# Patient Record
Sex: Female | Born: 1950 | Race: White | Hispanic: No | Marital: Married | Smoking: Never smoker
Health system: Southern US, Community
[De-identification: ages and names within clinical notes are randomized; demographics above are authoritative.]

## PROBLEM LIST (undated history)

## (undated) DIAGNOSIS — K2270 Barrett's esophagus without dysplasia: Secondary | ICD-10-CM

## (undated) DIAGNOSIS — M8580 Other specified disorders of bone density and structure, unspecified site: Secondary | ICD-10-CM

## (undated) DIAGNOSIS — J45909 Unspecified asthma, uncomplicated: Secondary | ICD-10-CM

## (undated) DIAGNOSIS — I456 Pre-excitation syndrome: Secondary | ICD-10-CM

## (undated) DIAGNOSIS — R011 Cardiac murmur, unspecified: Secondary | ICD-10-CM

## (undated) DIAGNOSIS — G4730 Sleep apnea, unspecified: Secondary | ICD-10-CM

## (undated) HISTORY — PX: OOPHORECTOMY: SHX86

## (undated) HISTORY — PX: FRACTURE SURGERY: SHX138

## (undated) HISTORY — PX: CHOLECYSTECTOMY: SHX55

## (undated) HISTORY — PX: APPENDECTOMY: SHX54

## (undated) HISTORY — PX: OTHER SURGICAL HISTORY: SHX169

## (undated) HISTORY — PX: TUBAL LIGATION: SHX77

## (undated) HISTORY — PX: HERNIA REPAIR: SHX51

## (undated) HISTORY — PX: ROUX-EN-Y PROCEDURE: SUR1287

## (undated) HISTORY — PX: TONSILLECTOMY: SUR1361

---

## 2004-08-15 ENCOUNTER — Emergency Department: Payer: Self-pay | Admitting: Internal Medicine

## 2004-12-23 ENCOUNTER — Ambulatory Visit: Payer: Self-pay | Admitting: Gastroenterology

## 2008-09-15 ENCOUNTER — Ambulatory Visit: Payer: Self-pay | Admitting: Internal Medicine

## 2010-11-04 ENCOUNTER — Ambulatory Visit: Payer: Self-pay

## 2010-12-05 ENCOUNTER — Ambulatory Visit: Payer: Self-pay | Admitting: Family Medicine

## 2011-08-10 ENCOUNTER — Ambulatory Visit: Payer: Self-pay | Admitting: Obstetrics and Gynecology

## 2011-08-10 LAB — HEMOGLOBIN: HGB: 12.3 g/dL (ref 12.0–16.0)

## 2011-08-13 ENCOUNTER — Ambulatory Visit: Payer: Self-pay | Admitting: Obstetrics and Gynecology

## 2013-05-07 ENCOUNTER — Inpatient Hospital Stay: Payer: Self-pay | Admitting: Student

## 2013-05-07 LAB — CBC WITH DIFFERENTIAL/PLATELET
BASOS PCT: 0.9 %
Basophil #: 0 10*3/uL (ref 0.0–0.1)
EOS PCT: 1.6 %
Eosinophil #: 0.1 10*3/uL (ref 0.0–0.7)
HCT: 37.7 % (ref 35.0–47.0)
HGB: 12.3 g/dL (ref 12.0–16.0)
LYMPHS PCT: 37.7 %
Lymphocyte #: 2.1 10*3/uL (ref 1.0–3.6)
MCH: 29.3 pg (ref 26.0–34.0)
MCHC: 32.6 g/dL (ref 32.0–36.0)
MCV: 90 fL (ref 80–100)
MONO ABS: 0.6 x10 3/mm (ref 0.2–0.9)
Monocyte %: 11 %
NEUTROS ABS: 2.7 10*3/uL (ref 1.4–6.5)
Neutrophil %: 48.8 %
Platelet: 186 10*3/uL (ref 150–440)
RBC: 4.19 10*6/uL (ref 3.80–5.20)
RDW: 14.1 % (ref 11.5–14.5)
WBC: 5.5 10*3/uL (ref 3.6–11.0)

## 2013-05-07 LAB — BASIC METABOLIC PANEL
ANION GAP: 8 (ref 7–16)
BUN: 17 mg/dL (ref 7–18)
CHLORIDE: 107 mmol/L (ref 98–107)
Calcium, Total: 9 mg/dL (ref 8.5–10.1)
Co2: 25 mmol/L (ref 21–32)
Creatinine: 0.81 mg/dL (ref 0.60–1.30)
EGFR (African American): >60
EGFR (Non-African Amer.): >60
GLUCOSE: 97 mg/dL (ref 65–99)
Osmolality: 281 (ref 275–301)
Potassium: 3.5 mmol/L (ref 3.5–5.1)
SODIUM: 140 mmol/L (ref 136–145)

## 2013-05-08 LAB — BASIC METABOLIC PANEL
ANION GAP: 4 — AB (ref 7–16)
BUN: 22 mg/dL — ABNORMAL HIGH (ref 7–18)
CALCIUM: 8.5 mg/dL (ref 8.5–10.1)
CHLORIDE: 110 mmol/L — AB (ref 98–107)
CO2: 28 mmol/L (ref 21–32)
Creatinine: 1.04 mg/dL (ref 0.60–1.30)
EGFR (African American): >60
EGFR (Non-African Amer.): 58 — ABNORMAL LOW
Glucose: 71 mg/dL (ref 65–99)
OSMOLALITY: 285 (ref 275–301)
POTASSIUM: 3.9 mmol/L (ref 3.5–5.1)
SODIUM: 142 mmol/L (ref 136–145)

## 2013-05-08 LAB — CBC WITH DIFFERENTIAL/PLATELET
BASOS ABS: 0 10*3/uL (ref 0.0–0.1)
Basophil %: 1 %
EOS ABS: 0.2 10*3/uL (ref 0.0–0.7)
EOS PCT: 3.5 %
HCT: 34.4 % — ABNORMAL LOW (ref 35.0–47.0)
HGB: 11.6 g/dL — ABNORMAL LOW (ref 12.0–16.0)
LYMPHS PCT: 38.3 %
Lymphocyte #: 1.7 10*3/uL (ref 1.0–3.6)
MCH: 30.4 pg (ref 26.0–34.0)
MCHC: 33.6 g/dL (ref 32.0–36.0)
MCV: 90 fL (ref 80–100)
MONO ABS: 0.9 x10 3/mm (ref 0.2–0.9)
Monocyte %: 20.8 %
NEUTROS ABS: 1.6 10*3/uL (ref 1.4–6.5)
NEUTROS PCT: 36.4 %
PLATELETS: 162 10*3/uL (ref 150–440)
RBC: 3.81 10*6/uL (ref 3.80–5.20)
RDW: 13.9 % (ref 11.5–14.5)
WBC: 4.3 10*3/uL (ref 3.6–11.0)

## 2014-06-30 NOTE — Discharge Summary (Signed)
PATIENT NAME:  Kirsten Scott, Kirsten Scott MR#:  [id number] DATE OF BIRTH:  [birth year]  DATE OF ADMISSION:  05/07/2013 DATE OF DISCHARGE:  05/09/2013  PRIMARY CARE PHYSICIAN:  At [facility].   CHIEF COMPLAINT:  Shortness of breath, cough.   DISCHARGE DIAGNOSES: 1.  Acute respiratory failure, resolved.  2.  Pneumonia.  3.  Asthma.  4.  History of Wolff-Parkinson-White syndrome with supraventricular tachycardia.  5.  History of heart murmur.   DISCHARGE MEDICATIONS:  Probiotic one cap once a day, Multi Day plus mineral oral tablet 1 tab once a day, levofloxacin 500 mg 1 tab every 24 hours for eight days, albuterol 2 puffs 4 times a day as needed for wheezing or shortness of breath, codeine/guaifenesin 10/100 mg per 5 mL please take 10 mL every six hours as needed for cough for a total of five days.   DIET:  Low sodium.   ACTIVITY:  As tolerated.   FOLLOWUP:  Please follow with PCP within 1 to 2 weeks.  Check an x-ray of the chest in 4 to 6 weeks to evaluate for complete resolution of the pneumonia.    SIGNIFICANT LABS AND IMAGING STUDIES:  Initial white count of 5.5, hemoglobin 12.3, platelets 186.  X-ray chest, PA and lateral, showing streaky infiltrate/pneumonia in the right lower lobe.   HISTORY OF PRESENT ILLNESS AND HOSPITAL COURSE:  For full details of H and P, please see the dictation on March 1 by Dr. Posey Pronto, but briefly this is a 64 year old female with history of asthma, which is at baseline pretty well controlled, history of WPW with SVT in the past who comes in for symptoms of body aches starting on Wednesday, some neck pain and cough, which is mostly dry and also shortness of breath.  She was noted to have O2 saturation of about 85% with ambulation in the ER, was also noted to have a pneumonia and therefore admitted to the hospitalist service.  She was started on Levaquin, cough suppressants and oxygen.  She did well and was also started on nebulizers.  Soon she was taken off of the oxygen.   Her cough is better with the help of Robitussin AC.  Her acute respiratory failure has resolved.  I suspect the pneumonia is likely bacterial and she will be discharged on another eight days of Levaquin.  Her wheezing and respiratory symptoms are better.  She still has some residual shoulder pain, which I suspect is a referred pain, but at this point, she has been ambulating in the hallways without significant hypoxemia.   PHYSICAL EXAMINATION: VITAL SIGNS:  On the day of discharge, temperature is 98, pulse rate 77, respiratory rate is 18, blood pressure 111/68, O2 sat 93% on room air and it was 95% with exertion.  GENERAL:  The patient is a developed female lying in bed, no obvious distress.  HEENT:  Normocephalic, atraumatic.  LUNGS:  The patient does have some rales and rhonchi on the right base without significant wheezing or crackles otherwise.  The patient does have a heart murmur. ABDOMEN:  Soft, nontender, nondistended.  EXTREMITIES:  Do not exhibit any edema.  The patient has no obvious rashes or lesions.   At this point, she will be discharged with outpatient follow-up.   Total time spent is 38 minutes.   CODE STATUS:  THE PATIENT IS A FULL CODE.   ____________________________ Vivien Presto, MD sa:ea D: 05/09/2013 16:14:46 ET T: 05/10/2013 03:11:30 ET JOB#: [id number]  cc: Vivien Presto, MD, <Dictator>  Vivien Presto MD ELECTRONICALLY SIGNED 05/12/2013 13:05

## 2014-06-30 NOTE — H&P (Signed)
PATIENT NAME:  Kirsten Scott, Kirsten Scott MR#:  [id number] DATE OF BIRTH:  07/16/1950  DATE OF ADMISSION:  05/07/2013  PRIMARY CARE PHYSICIAN: [facility]  REFERRING PHYSICIAN: Francene Castle, MD  CHIEF COMPLAINT: Shortness of breath, cough.   HISTORY OF PRESENT ILLNESS: The patient is a 64 year old white female with history of asthma, history of Wolff-Parkinson-White syndrome with history of SVT in the past who states that she started having body aches last Wednesday with pain in her shoulder and neck and then Thursday she started having a cough, mostly dry, and then started also becoming short of breath with activity. She went to the urgent care and had a flu test that was negative. She was sent to the ED because of complaint of pain with Wolff-Parkinson-White syndrome. The patient was evaluated in the ED, had a chest x-ray that showed streaky infiltrate pneumonia of the right lower lobe. The patient also was ambulated and was noted to have oxygen saturations in the 80s with activity. She does not report any chest pain. She did have upper respiratory wheezing, but that improved with nebulizer treatment. She did complain of being feverish and having chills, but her temperature here was normal. She denied any abdominal pain, nausea, vomiting or diarrhea. Denies any urinary symptoms.   PAST MEDICAL HISTORY:  1.  Asthma.  2.  WPW syndrome with SVT.  3.  Heart murmur.   PAST SURGICAL HISTORY:  1.  Status post right hip surgery. 2.  Status post gastric bypass. 3.  Status post cholecystectomy. 4.  Status post exploratory lap. 5.  Ovarian resection for a mass.  6.  Abdominal hernia surgery. 7.  Status post sinus surgery.   SOCIAL HISTORY: Does not smoke. Does not drink. No drugs. She used to be a respiratory therapist.   CURRENT MEDICATIONS: As outpatient she is on a Ventolin inhaler q. 6 p.r.n., Tylenol 650 q. 6 p.r.n.   FAMILY HISTORY: Mother with murmur, required a pacemaker for bradycardia.    REVIEW OF SYSTEMS: CONSTITUTIONAL: Complains of fever, fatigue, and weakness. No pain. No weight loss. No weight gain.  EYES: No blurred or double vision. No pain. No redness or inflammation. No glaucoma.  ENT: No tinnitus. No ear pain. No hearing loss. No seasonal or year-round allergies. No epistaxis. No nasal discharge. No difficulty swallowing.  RESPIRATORY: Complains of cough, upper respiratory wheezing. No hemoptysis. Complains of dyspnea. Has a history of asthma. No painful respirations. No syncope.  GASTROINTESTINAL: No nausea, vomiting, diarrhea. No abdominal pain. No hematemesis. No melena. No ulcer. No guarding. No IBS. No jaundice.  GENITOURINARY: Denies any dysuria, hematuria, renal calculus or frequency.  ENDOCRINE: Denies any polyuria, nocturia, or thyroid problems.  HEMATOLOGIC AND LYMPHATIC: Denies any major bruisability or bleeding.  SKIN: No acne. No rash. No changes in mole, hair, or skin.  MUSCULOSKELETAL: Complains of pain in the neck, back, or shoulder.  NEUROLOGIC: No numbness, CVA, TIA, or seizures.  PSYCHIATRIC: No anxiety, insomnia, or ADD.   PHYSICAL EXAMINATION: VITAL SIGNS: Temperature 98, pulse 70, respirations 18, blood pressure 131/61, O2 100% on room air.  GENERAL: The patient is a well-developed, well-nourished female in no acute distress.  HEENT: Head atraumatic, normocephalic. Pupils equally round and reactive to light and accommodation. There is no conjunctival pillar. No scleral icterus. Nasal exam shows no drainage or ulceration. Oropharynx is clear without any exudate.  NECK: Supple without any JVD.  CARDIOVASCULAR: Regular rate and rhythm. No murmurs, rubs, clicks, or gallops.  LUNGS: She has  occasional right lung rhonchi at the bases. No accessory muscle usage.  ABDOMEN: Soft, nontender, nondistended. Positive bowel sounds x4.  EXTREMITIES: No clubbing, cyanosis, or edema.  SKIN: No rash.  LYMPHATICS: No lymph nodes palpable. VASCULAR: Good DP  and PT pulses.  PSYCHIATRIC: Not anxious or depressed.  NEUROLOGIC: Awake, alert, and oriented x3. No focal deficits.   DIAGNOSTIC DATA: PA and lateral chest x-ray shows streaky infiltrate in the right lower lobe.   Glucose 97, BUN 17, creatinine 0.81, sodium 140, potassium 3.5, chloride 107, CO2 25. Calcium 9, WBC 5.5, hemoglobin 12.3, platelet count 186.  ASSESSMENT AND PLAN: The patient is a 64 year old white female with history of asthma, Wolff-Parkinson-White and history of supraventricular tachycardia who presents with body aches and shortness of breath, noted to have a right lower lobe pneumonia, hypoxia with ambulation.  1.  Pneumonia, likely due to hypoxia with ambulation. At this time, we will continue IV Levaquin. Add Mucinex to her treatment as well as cough suppressant.  2.  History of asthma with the patient reporting upper respiratory wheezing that improved with nebulizer treatments. I will continue the nebulizer treatment and then on discharge she can go back to her MDIs.  3.  Miscellaneous. We will place her on Lovenox for deep vein thrombosis prophylaxis.  TIME SPENT: 50 minutes.  ____________________________ Lafonda Mosses Posey Pronto, MD shp:sb D: 05/07/2013 19:55:45 ET T: 05/08/2013 07:18:26 ET JOB#: [id number]  cc: Brigitt Mcclish H. Posey Pronto, MD, <Dictator> Alric Seton MD ELECTRONICALLY SIGNED 05/16/2013 15:21

## 2014-07-01 NOTE — Op Note (Signed)
PATIENT NAME:  Kirsten Scott, PUCHALSKI MR#:  [id number] DATE OF BIRTH:  [birth year]  DATE OF PROCEDURE:  08/13/2011  PREOPERATIVE DIAGNOSIS: Complex right ovarian cyst.   POSTOPERATIVE DIAGNOSES:  1. Right ovarian fibroma.  2. Left ovarian adhesions.   PROCEDURES:  1. Laparoscopic bilateral salpingo-oophorectomy.  2. Lysis of adhesions.   SURGEON: Boykin Nearing, M.D.   FIRST ASSISTANT:  Franchot Erichsen, M.D.   ANESTHESIA:  General endotracheal anesthesia.  INDICATIONS: This is a 64 year old gravida 3, para 2 patient with a complex right ovarian cyst noted on ultrasound, MR, and CT.  FINDINGS: 3 x 3-cm right firm ovary consistent with ovarian fibroma.   PROCEDURE: After adequate general endotracheal anesthesia, the patient was placed in the dorsal supine position with the legs in the [place] stirrups. Abdomen and perineum were prepped in normal sterile fashion. A red Robinson catheter was used to catheterize the bladder, yielding 20 mL dark urine. A speculum was placed in the vagina. The anterior cervix was grasped with a single-tooth tenaculum and Kahn cannula placed in the endocervical canal to be used for uterine manipulation during the procedure. A 35-TI infraumbilical incision was made after injecting with 0.5% Marcaine. The laparoscope was advanced into the abdominal cavity under direct visualization with the Optiview cannula. The patient's abdomen was insufflated. A second port was placed in the left lower quadrant 3 cm medial to the anterior iliac spine. A 10-mm port was advanced into the abdominal cavity under direct visualization. A 5-mm port was placed in the right lower quadrant after injecting with 0.5% Marcaine and direct visualization used to place this port. On initial evaluation the patient had a free right ovarian cyst that was firm. The total ovary measures 3 x 3 cm.  The right ureter was visualized and was well away from the ovary and the infundibulopelvic ligament. The ovary was  grasped and brought medially and the infundibulopelvic ligament was cauterized with Kleppinger's and Harmonic scalpel was used to dissect the right ovary and tube free. Good hemostasis was noted. Again, the ureter appeared normal. Regarding the left ovarian fossa, on initial evaluation it was felt that her left ovary was previously removed. Further investigation revealed adhesions encompassing the left ovary and to the left ovarian fossa. These adhesions were taken down sharply after visualizing the ureter was well away from the dissection site. Once the adhesions were removed, the ovary appeared free. The left infundibulopelvic ligament was cauterized with Kleppinger's and dissected with the Harmonic and the left tube and ovary also dissected free. Good hemostasis noted. Ureter appeared normal. Both ovaries and tubes were placed into an Endopouch and brought through the left lower quadrant port site. The incision site and fascia site was extended given the firm nature of the right ovary. Once removed, pressure was lowered to the abdomen and good hemostasis was noted. A few bowel adhesions in the upper abdomen were documented and the patient's abdomen was deflated. The left lower quadrant site was closed with a running 2-0 Vicryl fascial layer and all three skin incisions were closed with interrupted 4-0 Vicryl. Sterile dressings applied. The Kahn cannula and single-tooth tenaculum were removed. Good hemostasis was noted to the cervix. There were no complications. Estimated blood loss was minimal. Urine output was 20 mL. Intraoperative fluids were 700 mL. The patient tolerated the procedure and was sent to the recovery room in good condition.     ____________________________ Boykin Nearing, MD tjs:bjt D: 08/13/2011 09:13:29 ET T: 08/13/2011 09:53:19 ET JOB#: [id number]  cc: Boykin Nearing, MD, <Dictator> Boykin Nearing MD ELECTRONICALLY SIGNED 08/14/2011 8:16

## 2014-07-10 ENCOUNTER — Other Ambulatory Visit: Payer: Self-pay | Admitting: Obstetrics and Gynecology

## 2014-07-10 DIAGNOSIS — Z1231 Encounter for screening mammogram for malignant neoplasm of breast: Secondary | ICD-10-CM

## 2014-07-19 ENCOUNTER — Ambulatory Visit
Admission: RE | Admit: 2014-07-19 | Discharge: 2014-07-19 | Disposition: A | Discharge status: Home / Self Care | Payer: 59 | Source: Ambulatory Visit | Attending: Obstetrics and Gynecology | Admitting: Obstetrics and Gynecology

## 2014-07-19 ENCOUNTER — Other Ambulatory Visit: Payer: Self-pay | Admitting: Obstetrics and Gynecology

## 2014-07-19 DIAGNOSIS — Z1231 Encounter for screening mammogram for malignant neoplasm of breast: Secondary | ICD-10-CM | POA: Diagnosis present

## 2015-11-27 ENCOUNTER — Other Ambulatory Visit: Payer: Self-pay | Admitting: Obstetrics and Gynecology

## 2015-11-27 DIAGNOSIS — Z1231 Encounter for screening mammogram for malignant neoplasm of breast: Secondary | ICD-10-CM

## 2015-12-03 ENCOUNTER — Other Ambulatory Visit: Payer: Self-pay | Admitting: Obstetrics and Gynecology

## 2015-12-03 ENCOUNTER — Ambulatory Visit
Admission: RE | Admit: 2015-12-03 | Discharge: 2015-12-03 | Disposition: A | Discharge status: Home / Self Care | Payer: Medicare Other | Source: Ambulatory Visit | Attending: Obstetrics and Gynecology | Admitting: Obstetrics and Gynecology

## 2015-12-03 DIAGNOSIS — Z1231 Encounter for screening mammogram for malignant neoplasm of breast: Secondary | ICD-10-CM

## 2017-01-21 ENCOUNTER — Other Ambulatory Visit: Payer: Self-pay | Admitting: Obstetrics and Gynecology

## 2017-01-21 DIAGNOSIS — Z1231 Encounter for screening mammogram for malignant neoplasm of breast: Secondary | ICD-10-CM

## 2017-02-10 ENCOUNTER — Encounter (INDEPENDENT_AMBULATORY_CARE_PROVIDER_SITE_OTHER): Payer: Self-pay

## 2017-02-10 ENCOUNTER — Ambulatory Visit
Admission: RE | Admit: 2017-02-10 | Discharge: 2017-02-10 | Disposition: A | Discharge status: Home / Self Care | Payer: Medicare Other | Source: Ambulatory Visit | Attending: Obstetrics and Gynecology | Admitting: Obstetrics and Gynecology

## 2017-02-10 DIAGNOSIS — Z1231 Encounter for screening mammogram for malignant neoplasm of breast: Secondary | ICD-10-CM | POA: Diagnosis not present

## 2017-11-30 ENCOUNTER — Other Ambulatory Visit: Payer: Self-pay | Admitting: Obstetrics and Gynecology

## 2017-11-30 DIAGNOSIS — Z1231 Encounter for screening mammogram for malignant neoplasm of breast: Secondary | ICD-10-CM

## 2017-12-08 ENCOUNTER — Other Ambulatory Visit: Payer: Self-pay | Admitting: Gastroenterology

## 2017-12-08 DIAGNOSIS — K862 Cyst of pancreas: Secondary | ICD-10-CM

## 2017-12-10 ENCOUNTER — Other Ambulatory Visit
Admission: RE | Admit: 2017-12-10 | Discharge: 2017-12-10 | Disposition: A | Discharge status: Home / Self Care | Payer: Medicare Other | Source: Ambulatory Visit | Attending: Gastroenterology | Admitting: Gastroenterology

## 2017-12-10 DIAGNOSIS — R197 Diarrhea, unspecified: Secondary | ICD-10-CM | POA: Insufficient documentation

## 2017-12-10 LAB — GASTROINTESTINAL PANEL BY PCR, STOOL (REPLACES STOOL CULTURE)

## 2017-12-10 LAB — C DIFFICILE QUICK SCREEN W PCR REFLEX
C DIFFICILE (CDIFF) INTERP: NOT DETECTED
C Diff antigen: NEGATIVE
C Diff toxin: NEGATIVE

## 2017-12-20 ENCOUNTER — Other Ambulatory Visit
Admission: RE | Admit: 2017-12-20 | Discharge: 2017-12-20 | Disposition: A | Discharge status: Home / Self Care | Payer: Medicare Other | Source: Ambulatory Visit | Attending: Gastroenterology | Admitting: Gastroenterology

## 2017-12-20 DIAGNOSIS — R197 Diarrhea, unspecified: Secondary | ICD-10-CM | POA: Insufficient documentation

## 2017-12-20 DIAGNOSIS — R194 Change in bowel habit: Secondary | ICD-10-CM | POA: Insufficient documentation

## 2017-12-23 ENCOUNTER — Ambulatory Visit: Payer: Medicare Other

## 2017-12-24 ENCOUNTER — Other Ambulatory Visit: Payer: Self-pay | Admitting: Gastroenterology

## 2017-12-24 ENCOUNTER — Ambulatory Visit
Admission: RE | Admit: 2017-12-24 | Discharge: 2017-12-24 | Disposition: A | Discharge status: Home / Self Care | Payer: Medicare Other | Source: Ambulatory Visit | Attending: Gastroenterology | Admitting: Gastroenterology

## 2017-12-24 DIAGNOSIS — K862 Cyst of pancreas: Secondary | ICD-10-CM

## 2017-12-24 DIAGNOSIS — N281 Cyst of kidney, acquired: Secondary | ICD-10-CM | POA: Diagnosis not present

## 2017-12-24 MED ORDER — GADOBUTROL 1 MMOL/ML IV SOLN
8.5000 mL | Freq: Once | INTRAVENOUS | Status: AC | PRN
  Administered 2017-12-24: 8.5 mL via INTRAVENOUS

## 2017-12-24 MED ORDER — GADOBENATE DIMEGLUMINE 529 MG/ML IV SOLN: 15.0000 mL | Freq: Once | INTRAVENOUS | Status: DC | PRN

## 2017-12-28 LAB — MISCELLANEOUS TEST

## 2018-02-09 ENCOUNTER — Encounter: Payer: Self-pay | Admitting: *Deleted

## 2018-02-10 ENCOUNTER — Ambulatory Visit
Admission: RE | Admit: 2018-02-10 | Discharge: 2018-02-10 | Disposition: A | Discharge status: Home / Self Care | Payer: Medicare Other | Source: Ambulatory Visit | Attending: Gastroenterology | Admitting: Gastroenterology

## 2018-02-10 ENCOUNTER — Encounter
Admission: RE | Disposition: A | Discharge status: Home / Self Care | Payer: Self-pay | Source: Ambulatory Visit | Attending: Gastroenterology

## 2018-02-10 ENCOUNTER — Ambulatory Visit: Payer: Medicare Other | Admitting: Anesthesiology

## 2018-02-10 DIAGNOSIS — R197 Diarrhea, unspecified: Secondary | ICD-10-CM | POA: Diagnosis present

## 2018-02-10 DIAGNOSIS — Z7951 Long term (current) use of inhaled steroids: Secondary | ICD-10-CM | POA: Insufficient documentation

## 2018-02-10 DIAGNOSIS — K6389 Other specified diseases of intestine: Secondary | ICD-10-CM | POA: Insufficient documentation

## 2018-02-10 DIAGNOSIS — J45909 Unspecified asthma, uncomplicated: Secondary | ICD-10-CM | POA: Insufficient documentation

## 2018-02-10 DIAGNOSIS — G4730 Sleep apnea, unspecified: Secondary | ICD-10-CM | POA: Insufficient documentation

## 2018-02-10 DIAGNOSIS — K6289 Other specified diseases of anus and rectum: Secondary | ICD-10-CM | POA: Diagnosis not present

## 2018-02-10 DIAGNOSIS — I456 Pre-excitation syndrome: Secondary | ICD-10-CM | POA: Insufficient documentation

## 2018-02-10 DIAGNOSIS — Z79899 Other long term (current) drug therapy: Secondary | ICD-10-CM | POA: Diagnosis not present

## 2018-02-10 DIAGNOSIS — K2270 Barrett's esophagus without dysplasia: Secondary | ICD-10-CM | POA: Diagnosis not present

## 2018-02-10 DIAGNOSIS — Q438 Other specified congenital malformations of intestine: Secondary | ICD-10-CM | POA: Insufficient documentation

## 2018-02-10 DIAGNOSIS — K449 Diaphragmatic hernia without obstruction or gangrene: Secondary | ICD-10-CM | POA: Diagnosis not present

## 2018-02-10 DIAGNOSIS — K529 Noninfective gastroenteritis and colitis, unspecified: Secondary | ICD-10-CM | POA: Insufficient documentation

## 2018-02-10 DIAGNOSIS — Z980 Intestinal bypass and anastomosis status: Secondary | ICD-10-CM | POA: Insufficient documentation

## 2018-02-10 DIAGNOSIS — D122 Benign neoplasm of ascending colon: Secondary | ICD-10-CM | POA: Insufficient documentation

## 2018-02-10 HISTORY — DX: Unspecified asthma, uncomplicated: J45.909

## 2018-02-10 HISTORY — DX: Pre-excitation syndrome: I45.6

## 2018-02-10 HISTORY — DX: Sleep apnea, unspecified: G47.30

## 2018-02-10 HISTORY — PX: ESOPHAGOGASTRODUODENOSCOPY (EGD) WITH PROPOFOL: SHX5813

## 2018-02-10 HISTORY — DX: Other specified disorders of bone density and structure, unspecified site: M85.80

## 2018-02-10 HISTORY — PX: COLONOSCOPY WITH PROPOFOL: SHX5780

## 2018-02-10 HISTORY — DX: Barrett's esophagus without dysplasia: K22.70

## 2018-02-10 HISTORY — DX: Cardiac murmur, unspecified: R01.1

## 2018-02-10 SURGERY — COLONOSCOPY WITH PROPOFOL
Anesthesia: General

## 2018-02-10 MED ORDER — SODIUM CHLORIDE 0.9 % IV SOLN
INTRAVENOUS | Status: DC
  Administered 2018-02-10: 09:00:00 via INTRAVENOUS

## 2018-02-10 MED ORDER — PROPOFOL 500 MG/50ML IV EMUL
INTRAVENOUS | Status: DC | PRN
  Administered 2018-02-10: 180 ug/kg/min via INTRAVENOUS

## 2018-02-10 MED ORDER — LIDOCAINE HCL URETHRAL/MUCOSAL 2 % EX GEL
CUTANEOUS | Status: AC
  Filled 2018-02-10: qty 5

## 2018-02-10 MED ORDER — PROPOFOL 500 MG/50ML IV EMUL
INTRAVENOUS | Status: AC
  Filled 2018-02-10: qty 50

## 2018-02-10 MED ORDER — FENTANYL CITRATE (PF) 100 MCG/2ML IJ SOLN
INTRAMUSCULAR | Status: DC | PRN
  Administered 2018-02-10: 50 ug via INTRAVENOUS
  Administered 2018-02-10 (×2): 25 ug via INTRAVENOUS

## 2018-02-10 MED ORDER — PHENYLEPHRINE HCL 10 MG/ML IJ SOLN
INTRAMUSCULAR | Status: DC | PRN
  Administered 2018-02-10 (×3): 100 ug via INTRAVENOUS

## 2018-02-10 MED ORDER — FENTANYL CITRATE (PF) 100 MCG/2ML IJ SOLN
INTRAMUSCULAR | Status: AC
  Filled 2018-02-10: qty 2

## 2018-02-10 MED ORDER — LIDOCAINE 2% (20 MG/ML) 5 ML SYRINGE
INTRAMUSCULAR | Status: DC | PRN
  Administered 2018-02-10: 30 mg via INTRAVENOUS

## 2018-02-10 MED ORDER — LIDOCAINE HCL (PF) 2 % IJ SOLN
INTRAMUSCULAR | Status: AC
  Filled 2018-02-10: qty 10

## 2018-02-10 MED ORDER — PROPOFOL 10 MG/ML IV BOLUS
INTRAVENOUS | Status: DC | PRN
  Administered 2018-02-10: 100 mg via INTRAVENOUS

## 2018-02-10 MED ORDER — PROPOFOL 10 MG/ML IV BOLUS
INTRAVENOUS | Status: AC
  Filled 2018-02-10: qty 20

## 2018-02-10 MED ORDER — EPHEDRINE SULFATE 50 MG/ML IJ SOLN
INTRAMUSCULAR | Status: DC | PRN
  Administered 2018-02-10: 10 mg via INTRAVENOUS

## 2018-02-10 NOTE — Op Note (Signed)
[facility] Gastroenterology Patient Name: Kirsten Scott [facility] Procedure Date: 02/10/2018 8:38 AM MRN: [id number] Account #: [id number] Date of Birth: 04/17/1950 Admit Type: Outpatient Age: 67 Room: [facility] ENDO ROOM 1 Gender: Female Note Status: Finalized Procedure:            Colonoscopy Indications:          Chronic diarrhea, Change in bowel habits Providers:            [name], MD Referring MD:         Valera Castle (Referring MD) Medicines:            Monitored Anesthesia Care Complications:        No immediate complications. Procedure:            Pre-Anesthesia Assessment:                       - ASA Grade Assessment: III - A patient with severe                        systemic disease.                       After obtaining informed consent, the colonoscope was                        passed under direct vision. Throughout the procedure,                        the patient's blood pressure, pulse, and oxygen                        saturations were monitored continuously. The                        Colonoscope was introduced through the anus and                        advanced to the the cecum, identified by appendiceal                        orifice and ileocecal valve. The colonoscopy was                        performed with moderate difficulty due to a redundant                        colon. Successful completion of the procedure was aided                        by using manual pressure. The patient tolerated the                        procedure well. The quality of the bowel preparation                        was fair. Findings:      The sigmoid colon, descending colon and transverse colon were moderately       redundant.      A 5 mm polyp was found in the proximal ascending colon. The polyp was  sessile. The polyp was removed with a cold snare. Resection and       retrieval were complete.      A less than 1 mm polyp was found in the  proximal ascending colon. The       polyp was sessile. The polyp was removed with a cold biopsy forceps.       Resection and retrieval were complete.      Biopsies for histology were taken with a cold forceps from the right       colon and left colon for evaluation of microscopic colitis.      A localized area of mildly erythematous mucosa was found in the distal       rectum, probable small area of rectal mucosal prolapse irritation. This       was biopsied with a cold forceps for histology.      The digital rectal exam was normal.      The retroflexed view of the distal rectum and anal verge was normal and       showed no anal or rectal abnormalities otherwise. Impression:           - Preparation of the colon was fair.                       - Redundant colon.                       - One 5 mm polyp in the proximal ascending colon,                        removed with a cold snare. Resected and retrieved.                       - One less than 1 mm polyp in the proximal ascending                        colon, removed with a cold biopsy forceps. Resected and                        retrieved.                       - Erythematous mucosa in the distal rectum. Biopsied.                       - The distal rectum and anal verge are normal on                        retroflexion view.                       - Biopsies were taken with a cold forceps from the                        right colon and left colon for evaluation of                        microscopic colitis. Recommendation:       - Discharge patient to home.                       - Await pathology results.                       -  Use Citrucel one tablespoon PO daily.                       - Imodium 1 tablet PO daily.                       - Await pathology results.                       - Return to GI clinic in 3 weeks.                       - Use Analpram HC Cream 2.5%: Apply externally TID for                        10 days. Procedure  Code(s):    --- Professional ---                       [phone], Colonoscopy, flexible; with removal of tumor(s),                        polyp(s), or other lesion(s) by snare technique                       45380, 51, Colonoscopy, flexible; with biopsy, single                        or multiple Diagnosis Code(s):    --- Professional ---                       D12.2, Benign neoplasm of ascending colon                       K62.89, Other specified diseases of anus and rectum                       K52.9, Noninfective gastroenteritis and colitis,                        unspecified                       R19.4, Change in bowel habit                       Q43.8, Other specified congenital malformations of                        intestine CPT copyright 2018 American Medical Association. All rights reserved. The codes documented in this report are preliminary and upon coder review may  be revised to meet current compliance requirements. [name], MD 02/10/2018 9:56:06 AM This report has been signed electronically. Number of Addenda: 0 Note Initiated On: 02/10/2018 8:38 AM Scope Withdrawal Time: 0 hours 19 minutes 49 seconds  Total Procedure Duration: 0 hours 29 minutes 1 second       [facility]

## 2018-02-10 NOTE — Anesthesia Preprocedure Evaluation (Signed)
Anesthesia Evaluation  Patient identified by MRN, date of birth, ID band Patient awake    Reviewed: Allergy & Precautions, H&P , NPO status , reviewed documented beta blocker date and time   Airway Mallampati: II   Neck ROM: full    Dental  (+) Chipped   Pulmonary asthma , sleep apnea ,  Last inhaler use last month   Pulmonary exam normal        Cardiovascular Normal cardiovascular exam+ Valvular Problems/Murmurs      Neuro/Psych    GI/Hepatic   Endo/Other    Renal/GU      Musculoskeletal   Abdominal   Peds  Hematology   Anesthesia Other Findings Past Medical History: No date: Asthma No date: Barrett's esophagus No date: Heart murmur No date: Osteopenia No date: Sleep apnea No date: WPW (Wolff-Parkinson-White syndrome)  Stable clinically w WPW & Murmur  Past Surgical History: No date: adenectomy No date: APPENDECTOMY No date: avulsion fx of distal fibula; Right No date: CHOLECYSTECTOMY No date: HERNIA REPAIR No date: hip muscle repair No date: OOPHORECTOMY; Bilateral No date: ROUX-EN-Y PROCEDURE No date: sal-pin oopherectomy No date: sinus surgery fungal mass No date: TONSILLECTOMY No date: TUBAL LIGATION     Reproductive/Obstetrics                             Anesthesia Physical Anesthesia Plan  ASA: III  Anesthesia Plan: General   Post-op Pain Management:    Induction: Intravenous  PONV Risk Score and Plan: 3 and Treatment may vary due to age or medical condition and TIVA  Airway Management Planned: Nasal Cannula and Natural Airway  Additional Equipment:   Intra-op Plan:   Post-operative Plan:   Informed Consent: I have reviewed the patients History and Physical, chart, labs and discussed the procedure including the risks, benefits and alternatives for the proposed anesthesia with the patient or authorized representative who has indicated his/her  understanding and acceptance.   Dental Advisory Given  Plan Discussed with: CRNA  Anesthesia Plan Comments:         Anesthesia Quick Evaluation

## 2018-02-10 NOTE — Op Note (Signed)
[facility] Gastroenterology Patient Name: Kirsten Scott Procedure Date: 02/10/2018 8:39 AM MRN: [id number] Account #: [id number] Date of Birth: 1950-11-22 Admit Type: Outpatient Age: 67 Room: [facility] ENDO ROOM 1 Gender: Female Note Status: Finalized Procedure:            Upper GI endoscopy Indications:          Diarrhea Providers:            [name], MD Medicines:            Monitored Anesthesia Care Complications:        No immediate complications. Procedure:            Pre-Anesthesia Assessment:                       - ASA Grade Assessment: III - A patient with severe                        systemic disease.                       After obtaining informed consent, the endoscope was                        passed under direct vision. Throughout the procedure,                        the patient's blood pressure, pulse, and oxygen                        saturations were monitored continuously. The Endoscope                        was introduced through the mouth, and advanced to the                        afferent and efferent jejunal loops. The upper GI                        endoscopy was accomplished without difficulty. The                        patient tolerated the procedure well. Findings:      The Z-line was irregular. Biopsies were taken with a cold forceps for       histology at/above the z-line in a quadrant fashion.      A small hiatal hernia was found. The Z-line was a variable distance from       incisors; the hiatal hernia was sliding.      Evidence of a Roux-en-Y gastrojejunostomy was found. The gastrojejunal       anastomosis was characterized by healthy appearing mucosa and an intact       staple line. This was traversed. The pouch-to-jejunum limb was       characterized by healthy appearing mucosa. The opposite side of the       anastomosis is healthy in appearance but I was unable to tell the length       of the curl due to a sharp  angulation. Biopsies were taken with a cold       forceps for histology of the gastric body/remnant.      The examined jejunum  was normal. Biopsies were taken with a cold forceps       for histology.      The gastric pouch is about 6 cm in size. Impression:           - Z-line irregular. Biopsied.                       - Small hiatal hernia.                       - Roux-en-Y gastrojejunostomy with gastrojejunal                        anastomosis characterized by an intact staple line and                        healthy appearing mucosa. Biopsied.                       - Normal examined jejunum. Biopsied. Recommendation:       - Await pathology results.                       - Return to GI clinic in 3 weeks. Procedure Code(s):    --- Professional ---                       [phone], Esophagogastroduodenoscopy, flexible, transoral;                        with biopsy, single or multiple Diagnosis Code(s):    --- Professional ---                       K22.8, Other specified diseases of esophagus                       K44.9, Diaphragmatic hernia without obstruction or                        gangrene                       Z98.0, Intestinal bypass and anastomosis status                       R19.7, Diarrhea, unspecified CPT copyright 2018 American Medical Association. All rights reserved. The codes documented in this report are preliminary and upon coder review may  be revised to meet current compliance requirements. [name], MD 02/10/2018 9:14:07 AM This report has been signed electronically. Number of Addenda: 0 Note Initiated On: 02/10/2018 8:39 AM      [facility]

## 2018-02-10 NOTE — Transfer of Care (Signed)
Immediate Anesthesia Transfer of Care Note  Patient: Kirsten Scott  Procedure(s) Performed: COLONOSCOPY WITH PROPOFOL (N/A ) ESOPHAGOGASTRODUODENOSCOPY (EGD) WITH PROPOFOL (N/A )  Patient Location: PACU and Endoscopy Unit  Anesthesia Type:General  Level of Consciousness: awake  Airway & Oxygen Therapy: Patient Spontanous Breathing and Patient connected to nasal cannula oxygen  Post-op Assessment: Report given to RN and Post -op Vital signs reviewed and stable  Post vital signs: Reviewed and stable  Last Vitals:  Vitals Value Taken Time  BP 98/49 02/10/2018  9:54 AM  Temp 36.1 C 02/10/2018  9:50 AM  Pulse 63 02/10/2018  9:55 AM  Resp 17 02/10/2018  9:55 AM  SpO2 98 % 02/10/2018  9:55 AM  Vitals shown include unvalidated device data.  Last Pain:  Vitals:   02/10/18 0950  TempSrc: Tympanic  PainSc:          Complications: No apparent anesthesia complications

## 2018-02-10 NOTE — H&P (Signed)
Outpatient short stay form Pre-procedure 02/10/2018 8:19 AM Kirsten Sails MD  Primary Physician: Dr. Johny Drilling  Reason for visit: EGD and colonoscopy  History of present illness: Patient is a next he 67-year-old female presenting today as above.  She has a history of chronic diarrhea this seems to be getting worse over the.  In the past 6 months or so.  She sees no blood in the stool.  There is some lower abdominal discomfort that resolves after a bowel movement.  Some fecal leakage.  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  Last colonoscopy was October 2006.  She tolerated her prep well.  She takes no aspirin or blood thinning agent.  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  She does take ibuprofen at times.    Current Facility-Administered Medications:  .  0.9 %  sodium chloride infusion, , Intravenous, Continuous, Kirsten Sails, MD  Medications Prior to Admission  Medication Sig Dispense Refill Last Dose  . acyclovir (ZOVIRAX) 400 MG tablet Take 400 mg by mouth 5 (five) times daily.     Marland Kitchen albuterol (PROVENTIL HFA;VENTOLIN HFA) 108 (90 Base) MCG/ACT inhaler Inhale into the lungs every 6 (six) hours as needed for wheezing or shortness of breath.   Past Month at Unknown time  . budesonide (PULMICORT) 0.5 MG/2ML nebulizer solution Take 0.5 mg by nebulization 2 (two) times daily.     . calcium carbonate (OSCAL) 1500 (600 Ca) MG TABS tablet Take 600 mg of elemental calcium by mouth 2 (two) times daily with a meal.   02/08/2018  . ibuprofen (ADVIL,MOTRIN) 100 MG/5ML suspension Take 200 mg by mouth every 4 (four) hours as needed.   Past Week at Unknown time  . ipratropium-albuterol (DUONEB) 0.5-2.5 (3) MG/3ML SOLN Take 3 mLs by nebulization.   Past Month at Unknown time  . Lactase-Lactobacillus 15-190  MG CPDR Take by mouth.   02/08/2018  . Multiple Vitamins-Minerals (MULTIVITAMIN WITH MINERALS) tablet Take 1 tablet by mouth daily.   02/08/2018  . VITAMIN D, CHOLECALCIFEROL, PO Take by mouth daily.   02/08/2018     Allergies  Allergen Reactions  . Latex Hives  . Terramycin [Oxytetracycline] Hives  . Vicodin [Hydrocodone-Acetaminophen]      Past Medical History:  Diagnosis Date  . Asthma   . Barrett's esophagus   . Heart murmur   . Osteopenia   . Sleep apnea   . WPW (Wolff-Parkinson-White syndrome)     Review of systems:      Physical Exam    Heart and lungs: Rhythm without rub or gallop, lungs are bilaterally clear.    HEENT: Normocephalic atraumatic eyes are anicteric    Other:    Pertinant exam for procedure: Soft nontender nondistended bowel sounds positive normoactive    Planned proceedures: EGD, colonoscopy and indicated procedures. I have discussed the risks benefits and complications of procedures to include not limited to bleeding, infection, perforation and the risk of sedation and the patient wishes to proceed.    Kirsten Sails, MD Gastroenterology 02/10/2018  8:19 AM

## 2018-02-10 NOTE — Anesthesia Postprocedure Evaluation (Signed)
Anesthesia Post Note  Patient: Kirsten Scott  Procedure(s) Performed: COLONOSCOPY WITH PROPOFOL (N/A ) ESOPHAGOGASTRODUODENOSCOPY (EGD) WITH PROPOFOL (N/A )  Patient location during evaluation: Endoscopy Anesthesia Type: General Level of consciousness: awake and alert Pain management: pain level controlled Vital Signs Assessment: post-procedure vital signs reviewed and stable Respiratory status: spontaneous breathing, nonlabored ventilation and respiratory function stable Cardiovascular status: blood pressure returned to baseline and stable Postop Assessment: no apparent nausea or vomiting Anesthetic complications: no     Last Vitals:  Vitals:   02/10/18 1030 02/10/18 1040  BP: (!) 142/72 136/78  Pulse:  (!) 51  Resp:  15  Temp:    SpO2:  98%    Last Pain:  Vitals:   02/10/18 0950  TempSrc: Tympanic  PainSc:                  Alphonsus Sias

## 2018-02-10 NOTE — Anesthesia Post-op Follow-up Note (Signed)
Anesthesia QCDR form completed.        

## 2018-02-11 ENCOUNTER — Ambulatory Visit
Admission: RE | Admit: 2018-02-11 | Discharge: 2018-02-11 | Disposition: A | Discharge status: Home / Self Care | Payer: Medicare Other | Source: Ambulatory Visit | Attending: Obstetrics and Gynecology | Admitting: Obstetrics and Gynecology

## 2018-02-11 DIAGNOSIS — Z1231 Encounter for screening mammogram for malignant neoplasm of breast: Secondary | ICD-10-CM | POA: Insufficient documentation

## 2018-02-11 LAB — SURGICAL PATHOLOGY

## 2018-05-31 ENCOUNTER — Ambulatory Visit
Admission: RE | Admit: 2018-05-31 | Discharge: 2018-05-31 | Disposition: A | Discharge status: Home / Self Care | Payer: Self-pay | Source: Ambulatory Visit | Attending: Gastroenterology | Admitting: Gastroenterology

## 2018-05-31 ENCOUNTER — Other Ambulatory Visit: Payer: Self-pay | Admitting: Family Medicine

## 2018-05-31 ENCOUNTER — Other Ambulatory Visit: Payer: Self-pay | Admitting: Gastroenterology

## 2018-05-31 DIAGNOSIS — K862 Cyst of pancreas: Secondary | ICD-10-CM

## 2019-03-27 ENCOUNTER — Other Ambulatory Visit: Payer: Self-pay | Admitting: Physician Assistant

## 2019-03-27 DIAGNOSIS — M7541 Impingement syndrome of right shoulder: Secondary | ICD-10-CM

## 2019-04-05 ENCOUNTER — Other Ambulatory Visit: Payer: Self-pay

## 2019-04-05 ENCOUNTER — Ambulatory Visit
Admission: RE | Admit: 2019-04-05 | Discharge: 2019-04-05 | Disposition: A | Discharge status: Home / Self Care | Payer: Medicare Other | Source: Ambulatory Visit | Attending: Physician Assistant | Admitting: Physician Assistant

## 2019-04-05 DIAGNOSIS — M7541 Impingement syndrome of right shoulder: Secondary | ICD-10-CM

## 2019-07-17 ENCOUNTER — Other Ambulatory Visit: Payer: Self-pay | Admitting: Internal Medicine

## 2019-07-17 DIAGNOSIS — Z1231 Encounter for screening mammogram for malignant neoplasm of breast: Secondary | ICD-10-CM

## 2019-11-08 ENCOUNTER — Other Ambulatory Visit: Payer: Self-pay

## 2019-11-08 ENCOUNTER — Ambulatory Visit
Admission: RE | Admit: 2019-11-08 | Discharge: 2019-11-08 | Disposition: A | Discharge status: Home / Self Care | Payer: Medicare Other | Source: Ambulatory Visit | Attending: Internal Medicine | Admitting: Internal Medicine

## 2019-11-08 DIAGNOSIS — Z1231 Encounter for screening mammogram for malignant neoplasm of breast: Secondary | ICD-10-CM | POA: Insufficient documentation

## 2020-06-24 ENCOUNTER — Other Ambulatory Visit: Payer: Self-pay | Admitting: Gastroenterology

## 2020-06-24 DIAGNOSIS — K862 Cyst of pancreas: Secondary | ICD-10-CM

## 2020-06-25 ENCOUNTER — Other Ambulatory Visit: Payer: Self-pay | Admitting: Gastroenterology

## 2020-06-25 DIAGNOSIS — K862 Cyst of pancreas: Secondary | ICD-10-CM

## 2020-07-04 ENCOUNTER — Other Ambulatory Visit: Payer: Self-pay

## 2020-07-04 ENCOUNTER — Ambulatory Visit
Admission: RE | Admit: 2020-07-04 | Discharge: 2020-07-04 | Disposition: A | Discharge status: Home / Self Care | Payer: Medicare Other | Source: Ambulatory Visit | Attending: Gastroenterology | Admitting: Gastroenterology

## 2020-07-04 DIAGNOSIS — K862 Cyst of pancreas: Secondary | ICD-10-CM | POA: Insufficient documentation

## 2020-07-04 MED ORDER — GADOBUTROL 1 MMOL/ML IV SOLN
8.0000 mL | Freq: Once | INTRAVENOUS | Status: AC | PRN
  Administered 2020-07-04: 8 mL via INTRAVENOUS

## 2020-10-17 ENCOUNTER — Other Ambulatory Visit: Payer: Self-pay | Admitting: Internal Medicine

## 2020-10-17 DIAGNOSIS — Z1231 Encounter for screening mammogram for malignant neoplasm of breast: Secondary | ICD-10-CM

## 2020-11-21 ENCOUNTER — Ambulatory Visit
Admission: RE | Admit: 2020-11-21 | Discharge: 2020-11-21 | Disposition: A | Discharge status: Home / Self Care | Payer: Medicare Other | Source: Ambulatory Visit | Attending: Internal Medicine | Admitting: Internal Medicine

## 2020-11-21 ENCOUNTER — Other Ambulatory Visit: Payer: Self-pay

## 2020-11-21 DIAGNOSIS — Z1231 Encounter for screening mammogram for malignant neoplasm of breast: Secondary | ICD-10-CM | POA: Diagnosis not present

## 2020-11-29 ENCOUNTER — Encounter: Payer: Self-pay | Admitting: *Deleted

## 2020-12-02 ENCOUNTER — Encounter
Admission: RE | Disposition: A | Discharge status: Home / Self Care | Payer: Self-pay | Source: Home / Self Care | Attending: Gastroenterology

## 2020-12-02 ENCOUNTER — Ambulatory Visit
Admission: RE | Admit: 2020-12-02 | Discharge: 2020-12-02 | Disposition: A | Discharge status: Home / Self Care | Payer: Medicare Other | Attending: Gastroenterology | Admitting: Gastroenterology

## 2020-12-02 ENCOUNTER — Ambulatory Visit: Payer: Medicare Other | Admitting: Anesthesiology

## 2020-12-02 ENCOUNTER — Encounter: Payer: Self-pay | Admitting: *Deleted

## 2020-12-02 DIAGNOSIS — Z79899 Other long term (current) drug therapy: Secondary | ICD-10-CM | POA: Insufficient documentation

## 2020-12-02 DIAGNOSIS — K219 Gastro-esophageal reflux disease without esophagitis: Secondary | ICD-10-CM | POA: Insufficient documentation

## 2020-12-02 DIAGNOSIS — Z881 Allergy status to other antibiotic agents status: Secondary | ICD-10-CM | POA: Insufficient documentation

## 2020-12-02 DIAGNOSIS — Z09 Encounter for follow-up examination after completed treatment for conditions other than malignant neoplasm: Secondary | ICD-10-CM | POA: Insufficient documentation

## 2020-12-02 DIAGNOSIS — Z791 Long term (current) use of non-steroidal anti-inflammatories (NSAID): Secondary | ICD-10-CM | POA: Diagnosis not present

## 2020-12-02 DIAGNOSIS — Z885 Allergy status to narcotic agent status: Secondary | ICD-10-CM | POA: Diagnosis not present

## 2020-12-02 DIAGNOSIS — Z8719 Personal history of other diseases of the digestive system: Secondary | ICD-10-CM | POA: Diagnosis not present

## 2020-12-02 DIAGNOSIS — Z980 Intestinal bypass and anastomosis status: Secondary | ICD-10-CM | POA: Diagnosis not present

## 2020-12-02 DIAGNOSIS — K449 Diaphragmatic hernia without obstruction or gangrene: Secondary | ICD-10-CM | POA: Diagnosis not present

## 2020-12-02 DIAGNOSIS — Z9884 Bariatric surgery status: Secondary | ICD-10-CM | POA: Insufficient documentation

## 2020-12-02 DIAGNOSIS — Z91040 Latex allergy status: Secondary | ICD-10-CM | POA: Diagnosis not present

## 2020-12-02 HISTORY — PX: ESOPHAGOGASTRODUODENOSCOPY: SHX5428

## 2020-12-02 SURGERY — EGD (ESOPHAGOGASTRODUODENOSCOPY)
Anesthesia: General

## 2020-12-02 MED ORDER — PROPOFOL 500 MG/50ML IV EMUL
INTRAVENOUS | Status: AC
  Filled 2020-12-02: qty 150

## 2020-12-02 MED ORDER — LIDOCAINE HCL (PF) 2 % IJ SOLN
INTRAMUSCULAR | Status: AC
  Filled 2020-12-02: qty 5

## 2020-12-02 MED ORDER — PROPOFOL 10 MG/ML IV BOLUS
INTRAVENOUS | Status: DC | PRN
  Administered 2020-12-02: 70 mg via INTRAVENOUS
  Administered 2020-12-02: 30 mg via INTRAVENOUS

## 2020-12-02 MED ORDER — SODIUM CHLORIDE 0.9 % IV SOLN: INTRAVENOUS | Status: DC

## 2020-12-02 MED ORDER — PROPOFOL 500 MG/50ML IV EMUL
INTRAVENOUS | Status: DC | PRN
  Administered 2020-12-02: 120 ug/kg/min via INTRAVENOUS

## 2020-12-02 MED ORDER — LIDOCAINE 2% (20 MG/ML) 5 ML SYRINGE
INTRAMUSCULAR | Status: DC | PRN
  Administered 2020-12-02: 25 mg via INTRAVENOUS

## 2020-12-02 NOTE — Anesthesia Preprocedure Evaluation (Signed)
Anesthesia Evaluation  Patient identified by MRN, date of birth, ID band Patient awake    Reviewed: Allergy & Precautions, H&P , NPO status , Patient's Chart, lab work & pertinent test results, reviewed documented beta blocker date and time   Airway Mallampati: II   Neck ROM: full    Dental  (+) Poor Dentition   Pulmonary asthma , sleep apnea ,    Pulmonary exam normal        Cardiovascular Exercise Tolerance: Good Normal cardiovascular exam+ Valvular Problems/Murmurs  Rhythm:regular Rate:Normal     Neuro/Psych negative neurological ROS  negative psych ROS   GI/Hepatic negative GI ROS, Neg liver ROS,   Endo/Other  negative endocrine ROS  Renal/GU negative Renal ROS  negative genitourinary   Musculoskeletal   Abdominal   Peds  Hematology negative hematology ROS (+)   Anesthesia Other Findings Past Medical History: No date: Asthma No date: Barrett's esophagus No date: Heart murmur No date: Osteopenia No date: Sleep apnea No date: WPW (Wolff-Parkinson-White syndrome) Past Surgical History: No date: adenectomy No date: APPENDECTOMY No date: avulsion fx of distal fibula; Right No date: CHOLECYSTECTOMY 02/10/2018: COLONOSCOPY WITH PROPOFOL; N/A     Comment:  Procedure: COLONOSCOPY WITH PROPOFOL;  Surgeon:               [name], MD;  Location: [facility] ENDOSCOPY;                Service: Endoscopy;  Laterality: N/A; 02/10/2018: ESOPHAGOGASTRODUODENOSCOPY (EGD) WITH PROPOFOL; N/A     Comment:  Procedure: ESOPHAGOGASTRODUODENOSCOPY (EGD) WITH               PROPOFOL;  Surgeon: [name], MD;  Location:               [facility] ENDOSCOPY;  Service: Endoscopy;  Laterality: N/A; No date: FRACTURE SURGERY No date: HERNIA REPAIR No date: hip muscle repair No date: OOPHORECTOMY; Bilateral No date: ROUX-EN-Y PROCEDURE No date: sal-pin oopherectomy No date: sinus surgery fungal mass No date: TONSILLECTOMY No  date: TUBAL LIGATION   Reproductive/Obstetrics negative OB ROS                             Anesthesia Physical Anesthesia Plan  ASA: 3  Anesthesia Plan: General   Post-op Pain Management:    Induction:   PONV Risk Score and Plan:   Airway Management Planned:   Additional Equipment:   Intra-op Plan:   Post-operative Plan:   Informed Consent: I have reviewed the patients History and Physical, chart, labs and discussed the procedure including the risks, benefits and alternatives for the proposed anesthesia with the patient or authorized representative who has indicated his/her understanding and acceptance.     Dental Advisory Given  Plan Discussed with: CRNA  Anesthesia Plan Comments:         Anesthesia Quick Evaluation

## 2020-12-02 NOTE — Transfer of Care (Signed)
Immediate Anesthesia Transfer of Care Note  Patient: Kirsten Scott  Procedure(s) Performed: ESOPHAGOGASTRODUODENOSCOPY (EGD)  Patient Location: Endoscopy Unit  Anesthesia Type:General  Level of Consciousness: awake and alert   Airway & Oxygen Therapy: Patient Spontanous Breathing  Post-op Assessment: Report given to RN and Post -op Vital signs reviewed and stable  Post vital signs: Reviewed  Last Vitals:  Vitals Value Taken Time  BP 96/48 12/02/20 0758  Temp 36 C 12/02/20 0758  Pulse 57 12/02/20 0758  Resp 8 12/02/20 0758  SpO2 93 % 12/02/20 0758  Vitals shown include unvalidated device data.  Last Pain:  Vitals:   12/02/20 0758  TempSrc: Tympanic  PainSc: Asleep         Complications: No notable events documented.

## 2020-12-02 NOTE — Interval H&P Note (Signed)
History and Physical Interval Note:  12/02/2020 7:42 AM  Kirsten Scott  has presented today for surgery, with the diagnosis of BARRETTS ESOPHAGUS.  The various methods of treatment have been discussed with the patient and family. After consideration of risks, benefits and other options for treatment, the patient has consented to  Procedure(s): ESOPHAGOGASTRODUODENOSCOPY (EGD) (N/A) as a surgical intervention.  The patient's history has been reviewed, patient examined, no change in status, stable for surgery.  I have reviewed the patient's chart and labs.  Questions were answered to the patient's satisfaction.     Lesly Rubenstein  Ok to proceed with EGD

## 2020-12-02 NOTE — Op Note (Addendum)
[facility] Gastroenterology Patient Name: Kirsten Scott [facility] Procedure Date: 12/02/2020 6:59 AM MRN: [id number] Account #: [id number] Date of Birth: 03-08-1951 Admit Type: Outpatient Age: 70 Room: [facility] ENDO ROOM 3 Gender: Female Note Status: Supervisor Override Instrument Name: Altamese Cabal Endoscope [id number] Procedure:             Upper GI endoscopy Indications:           Follow-up of Barrett's esophagus Providers:             Andrey Farmer MD, MD Referring MD:          Gladstone Lighter, MD (Referring MD) Medicines:             Monitored Anesthesia Care Complications:         No immediate complications. Procedure:             Pre-Anesthesia Assessment:                        - Prior to the procedure, a History and Physical was                         performed, and patient medications and allergies were                         reviewed. The patient is competent. The risks and                         benefits of the procedure and the sedation options and                         risks were discussed with the patient. All questions                         were answered and informed consent was obtained.                         Patient identification and proposed procedure were                         verified by the physician, the nurse, the anesthetist                         and the technician in the endoscopy suite. Mental                         Status Examination: alert and oriented. Airway                         Examination: normal oropharyngeal airway and neck                         mobility. Respiratory Examination: clear to                         auscultation. CV Examination: normal. Prophylactic                         Antibiotics: The patient does not require prophylactic  antibiotics. Prior Anticoagulants: The patient has                         taken no previous anticoagulant or antiplatelet                         agents. ASA Grade  Assessment: II - A patient with mild                         systemic disease. After reviewing the risks and                         benefits, the patient was deemed in satisfactory                         condition to undergo the procedure. The anesthesia                         plan was to use monitored anesthesia care (MAC).                         Immediately prior to administration of medications,                         the patient was re-assessed for adequacy to receive                         sedatives. The heart rate, respiratory rate, oxygen                         saturations, blood pressure, adequacy of pulmonary                         ventilation, and response to care were monitored                         throughout the procedure. The physical status of the                         patient was re-assessed after the procedure.                        After obtaining informed consent, the endoscope was                         passed under direct vision. Throughout the procedure,                         the patient's blood pressure, pulse, and oxygen                         saturations were monitored continuously. The Endoscope                         was introduced through the mouth, and advanced to the                         second part of duodenum. The upper GI endoscopy was  accomplished without difficulty. The patient tolerated                         the procedure well. Findings:      A small hiatal hernia was present.      The esophagus and gastroesophageal junction were examined with white       light and narrow band imaging (NBI). There was no visual evidence of       Barrett's esophagus.      Evidence of a Roux-en-Y gastrojejunostomy was found. The gastrojejunal       anastomosis was characterized by healthy appearing mucosa. The       jejunojejunal anastomosis was characterized by healthy appearing mucosa.      The examined jejunum was  normal. Impression:            - Small hiatal hernia.                        - There is no endoscopic evidence of Barrett's                         esophagus.                        - Roux-en-Y gastrojejunostomy with gastrojejunal                         anastomosis characterized by healthy appearing mucosa.                        - Normal examined jejunum.                        - No specimens collected. Recommendation:        - Discharge patient to home.                        - Resume previous diet.                        - Continue present medications.                        - Return to referring physician as previously                         scheduled. Procedure Code(s):     --- Professional ---                        [phone], Esophagogastroduodenoscopy, flexible,                         transoral; diagnostic, including collection of                         specimen(s) by brushing or washing, when performed                         (separate procedure) Diagnosis Code(s):     --- Professional ---                        K44.9, Diaphragmatic hernia  without obstruction or                         gangrene                        Z98.0, Intestinal bypass and anastomosis status                        K21.9, Gastro-esophageal reflux disease without                         esophagitis CPT copyright 2019 American Medical Association. All rights reserved. The codes documented in this report are preliminary and upon coder review may  be revised to meet current compliance requirements. Andrey Farmer MD, MD 12/02/2020 7:59:34 AM Number of Addenda: 0 Note Initiated On: 12/02/2020 6:59 AM Estimated Blood Loss:  Estimated blood loss: none.      [facility]

## 2020-12-02 NOTE — Anesthesia Postprocedure Evaluation (Signed)
Anesthesia Post Note  Patient: Kirsten Scott  Procedure(s) Performed: ESOPHAGOGASTRODUODENOSCOPY (EGD)  Patient location during evaluation: PACU Anesthesia Type: General Level of consciousness: awake and alert Pain management: pain level controlled Vital Signs Assessment: post-procedure vital signs reviewed and stable Respiratory status: spontaneous breathing, nonlabored ventilation, respiratory function stable and patient connected to nasal cannula oxygen Cardiovascular status: blood pressure returned to baseline and stable Postop Assessment: no apparent nausea or vomiting Anesthetic complications: no   No notable events documented.   Last Vitals:  Vitals:   12/02/20 0818 12/02/20 0828  BP: (!) 101/51 122/65  Pulse: (!) 54 60  Resp: 17 11  Temp:    SpO2: 97% 100%    Last Pain:  Vitals:   12/02/20 0828  TempSrc:   PainSc: 0-No pain                 Molli Barrows

## 2020-12-02 NOTE — H&P (Signed)
Outpatient short stay form Pre-procedure 12/02/2020  Lesly Rubenstein, MD  Primary Physician: Gladstone Lighter, MD  Reason for visit:  BE's  History of present illness:   70 y/o lady with history of roux-en-y and cholecystectomy here for EGD for reported history of BE's. No blood thinners. No family history of GI malignancies. No significant symptoms.    Current Facility-Administered Medications:    0.9 %  sodium chloride infusion, , Intravenous, Continuous, Guida Asman, Hilton Cork, MD  Medications Prior to Admission  Medication Sig Dispense Refill Last Dose   calcium carbonate (OSCAL) 1500 (600 Ca) MG TABS tablet Take 600 mg of elemental calcium by mouth 2 (two) times daily with a meal.   12/01/2020   Celecoxib (CELEBREX PO) Take by mouth 2 (two) times daily.   12/01/2020   Cyanocobalamin (VITAMIN B 12 PO) Take 1,000 mcg by mouth daily.   12/01/2020   gabapentin (NEURONTIN) 100 MG capsule Take 100 mg by mouth 3 (three) times daily.   12/01/2020   Lactase-Lactobacillus 15-190 MG CPDR Take by mouth.   12/01/2020   Multiple Vitamins-Minerals (MULTIVITAMIN WITH MINERALS) tablet Take 1 tablet by mouth daily.   12/01/2020   omeprazole (PRILOSEC) 20 MG capsule Take 20 mg by mouth daily.   12/01/2020   traZODone (DESYREL) 100 MG tablet Take 100 mg by mouth at bedtime.   12/01/2020   VITAMIN D, CHOLECALCIFEROL, PO Take by mouth daily.   12/01/2020   acyclovir (ZOVIRAX) 400 MG tablet Take 400 mg by mouth 5 (five) times daily. (Patient not taking: Reported on 12/02/2020)   Not Taking   albuterol (PROVENTIL HFA;VENTOLIN HFA) 108 (90 Base) MCG/ACT inhaler Inhale into the lungs every 6 (six) hours as needed for wheezing or shortness of breath. (Patient not taking: Reported on 12/02/2020)   Not Taking   budesonide (PULMICORT) 0.5 MG/2ML nebulizer solution Take 0.5 mg by nebulization 2 (two) times daily. (Patient not taking: Reported on 12/02/2020)   Not Taking   ibuprofen (ADVIL,MOTRIN) 100 MG/5ML suspension Take  200 mg by mouth every 4 (four) hours as needed. (Patient not taking: Reported on 12/02/2020)   Not Taking   ipratropium-albuterol (DUONEB) 0.5-2.5 (3) MG/3ML SOLN Take 3 mLs by nebulization. (Patient not taking: Reported on 12/02/2020)   Not Taking     Allergies  Allergen Reactions   Latex Hives   Terramycin [Oxytetracycline] Hives   Vicodin [Hydrocodone-Acetaminophen]      Past Medical History:  Diagnosis Date   Asthma    Barrett's esophagus    Heart murmur    Osteopenia    Sleep apnea    WPW (Wolff-Parkinson-White syndrome)     Review of systems:  Otherwise negative.    Physical Exam  Gen: Alert, oriented. Appears stated age.  HEENT: PERRLA. Lungs: No respiratory distress CV: RRR Abd: soft, benign, no masses Ext: No edema    Planned procedures: Proceed with EGD. The patient understands the nature of the planned procedure, indications, risks, alternatives and potential complications including but not limited to bleeding, infection, perforation, damage to internal organs and possible oversedation/side effects from anesthesia. The patient agrees and gives consent to proceed.  Please refer to procedure notes for findings, recommendations and patient disposition/instructions.     Lesly Rubenstein, MD [facility] Gastroenterology

## 2020-12-03 ENCOUNTER — Encounter: Payer: Self-pay | Admitting: Gastroenterology

## 2021-12-01 ENCOUNTER — Other Ambulatory Visit: Payer: Self-pay | Admitting: Internal Medicine

## 2021-12-01 DIAGNOSIS — Z1231 Encounter for screening mammogram for malignant neoplasm of breast: Secondary | ICD-10-CM

## 2021-12-24 ENCOUNTER — Ambulatory Visit
Admission: RE | Admit: 2021-12-24 | Discharge: 2021-12-24 | Disposition: A | Discharge status: Home / Self Care | Payer: Medicare Other | Source: Ambulatory Visit | Attending: Internal Medicine | Admitting: Internal Medicine

## 2021-12-24 DIAGNOSIS — Z1231 Encounter for screening mammogram for malignant neoplasm of breast: Secondary | ICD-10-CM | POA: Insufficient documentation

## 2022-04-09 IMAGING — MG MM DIGITAL SCREENING BILAT W/ TOMO AND CAD
8 series · 8 of 24 positions shown · non-contrast
Comparison: Previous exam(s).

CLINICAL DATA: Screening.

EXAM:
DIGITAL SCREENING BILATERAL MAMMOGRAM WITH TOMOSYNTHESIS AND CAD
TECHNIQUE: Bilateral screening digital craniocaudal and mediolateral oblique
mammograms were obtained. Bilateral screening digital breast
tomosynthesis was performed. The images were evaluated with
computer-aided detection.

[R MLO synth-2D]
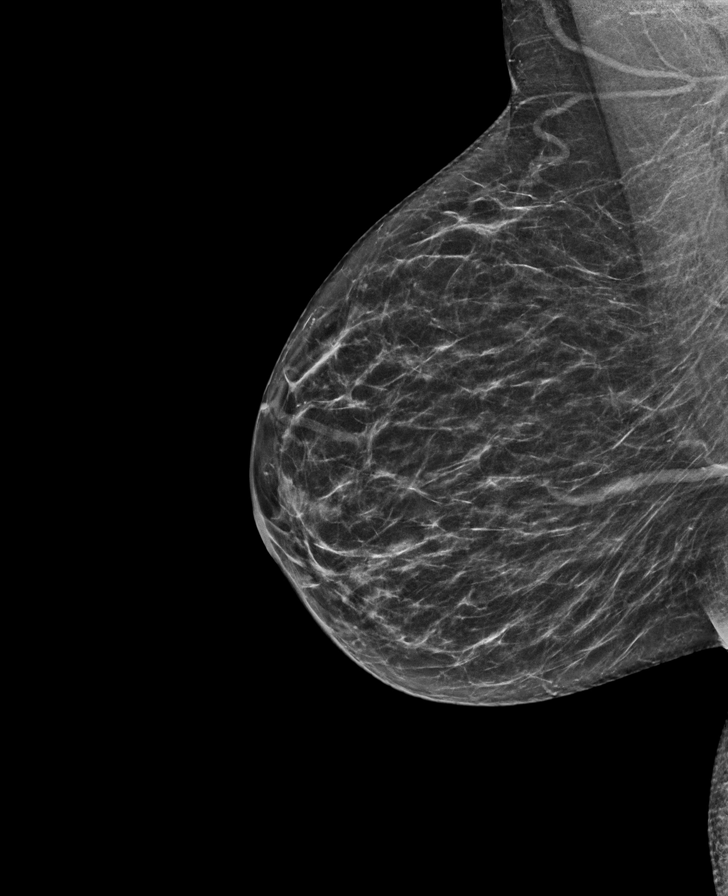

[L MLO synth-2D]
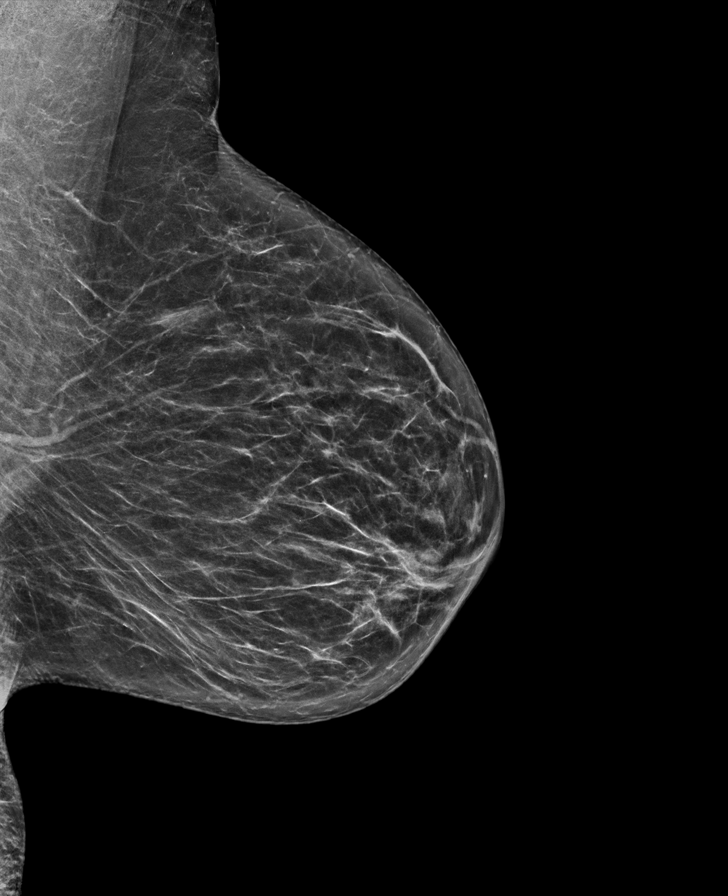

[R CC synth-2D]
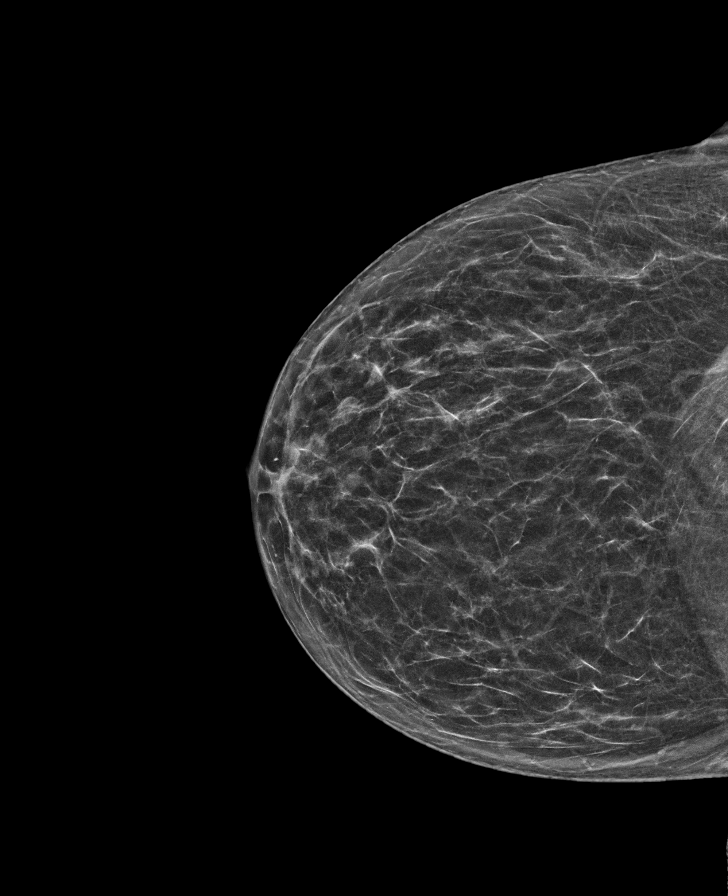

[L CC synth-2D]
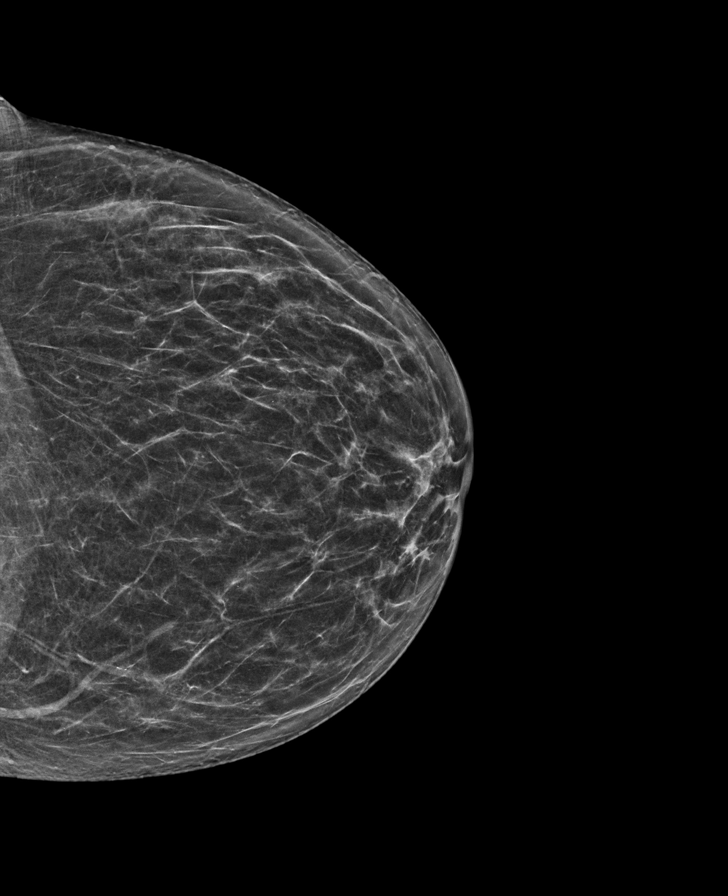

[L MLO tomo · tomo slice 33/66.0]
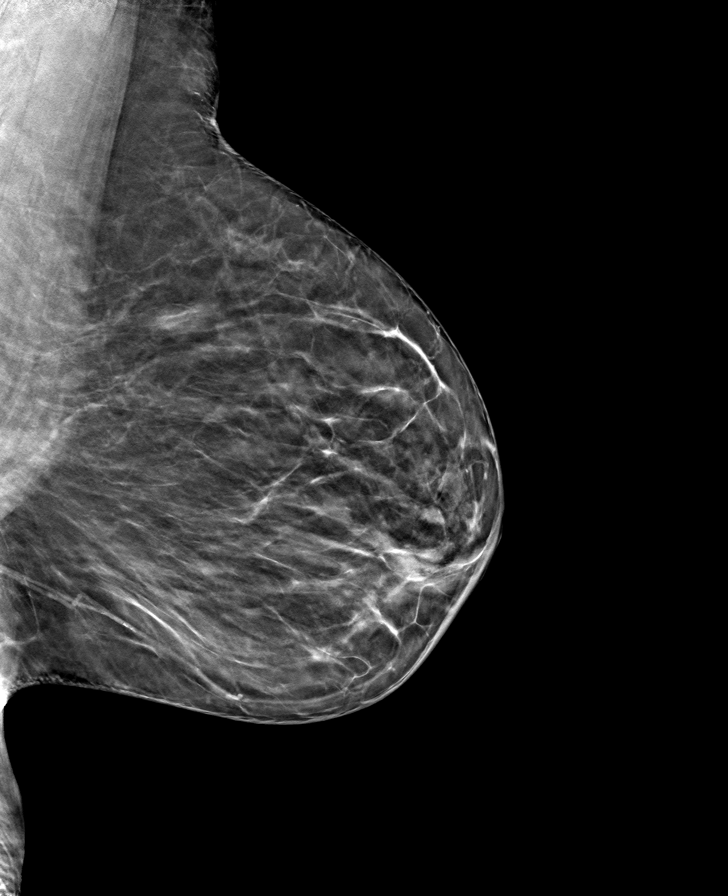

[R CC tomo · tomo slice 29/57.0]
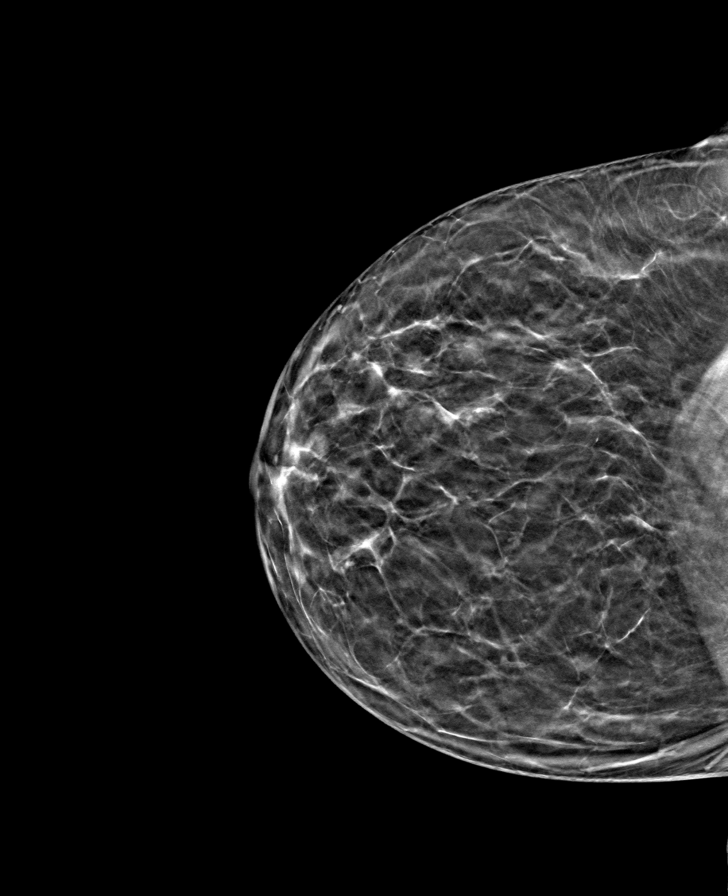

[L CC tomo · tomo slice 31/60.0]
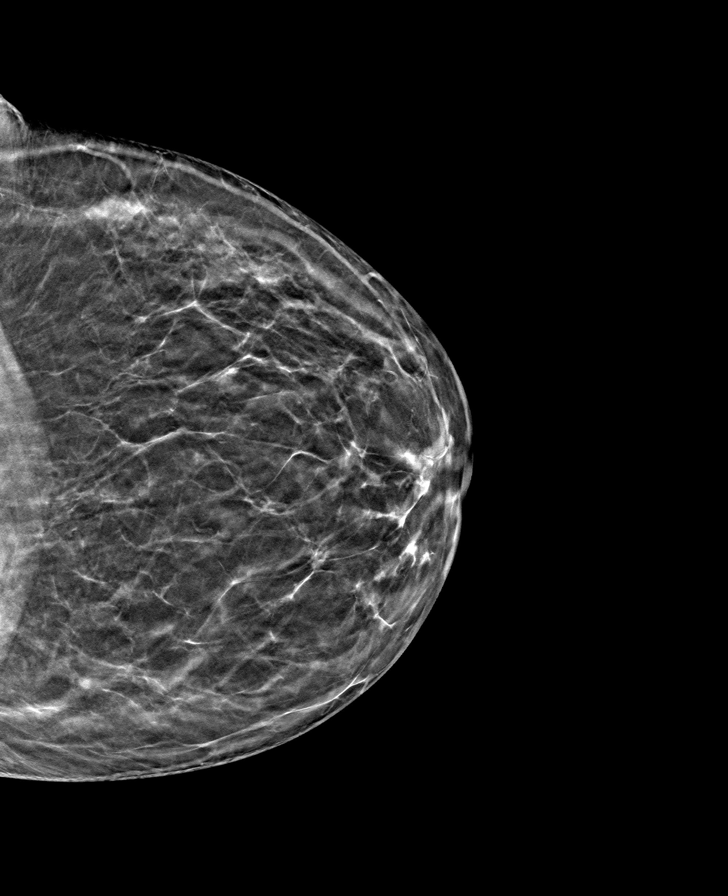

[R MLO tomo · tomo slice 30/59.0]
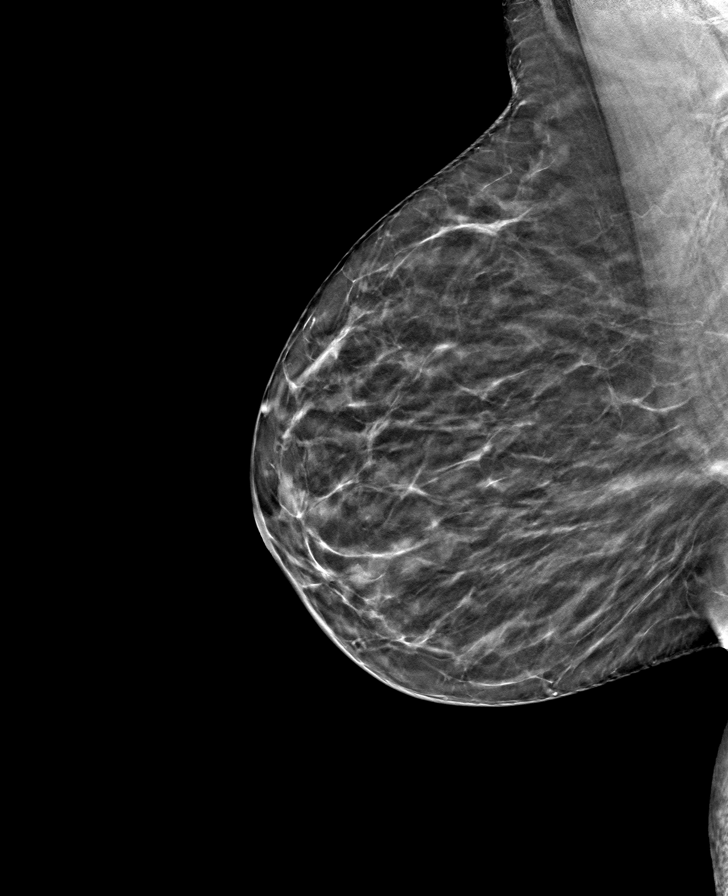

[8 of 24 positions shown; findings below may reference images not displayed]

ACR Breast Density Category b: There are scattered areas of
fibroglandular density.
FINDINGS: There are no findings suspicious for malignancy.
IMPRESSION: No mammographic evidence of malignancy. A result letter of this
screening mammogram will be mailed directly to the patient.

RECOMMENDATION:
Screening mammogram in one year. (Code:51-O-LD2)

BI-RADS CATEGORY  1: Negative.

## 2022-12-31 ENCOUNTER — Other Ambulatory Visit: Payer: Self-pay | Admitting: Internal Medicine

## 2022-12-31 DIAGNOSIS — Z1231 Encounter for screening mammogram for malignant neoplasm of breast: Secondary | ICD-10-CM

## 2023-01-18 ENCOUNTER — Ambulatory Visit
Admission: RE | Admit: 2023-01-18 | Discharge: 2023-01-18 | Disposition: A | Discharge status: Home / Self Care | Payer: Medicare Other | Source: Ambulatory Visit | Attending: Internal Medicine | Admitting: Internal Medicine

## 2023-01-18 DIAGNOSIS — Z1231 Encounter for screening mammogram for malignant neoplasm of breast: Secondary | ICD-10-CM | POA: Diagnosis present

## 2023-05-27 ENCOUNTER — Other Ambulatory Visit: Payer: Self-pay | Admitting: Gastroenterology

## 2023-05-27 DIAGNOSIS — D509 Iron deficiency anemia, unspecified: Secondary | ICD-10-CM

## 2023-05-27 DIAGNOSIS — K862 Cyst of pancreas: Secondary | ICD-10-CM

## 2023-06-08 ENCOUNTER — Ambulatory Visit
Admission: RE | Admit: 2023-06-08 | Discharge: 2023-06-08 | Disposition: A | Discharge status: Home / Self Care | Source: Ambulatory Visit | Attending: Gastroenterology | Admitting: Gastroenterology

## 2023-06-08 DIAGNOSIS — K862 Cyst of pancreas: Secondary | ICD-10-CM | POA: Diagnosis present

## 2023-06-08 DIAGNOSIS — D509 Iron deficiency anemia, unspecified: Secondary | ICD-10-CM | POA: Diagnosis present

## 2023-06-08 MED ORDER — IOHEXOL 300 MG/ML  SOLN
100.0000 mL | Freq: Once | INTRAMUSCULAR | Status: AC | PRN
  Administered 2023-06-08: 100 mL via INTRAVENOUS

## 2023-08-15 ENCOUNTER — Other Ambulatory Visit: Payer: Self-pay | Admitting: Medical Genetics

## 2023-08-16 ENCOUNTER — Other Ambulatory Visit: Payer: Self-pay

## 2023-08-16 ENCOUNTER — Encounter
Admission: RE | Disposition: A | Discharge status: Home / Self Care | Payer: Self-pay | Source: Home / Self Care | Attending: Gastroenterology

## 2023-08-16 ENCOUNTER — Ambulatory Visit: Admitting: Anesthesiology

## 2023-08-16 ENCOUNTER — Ambulatory Visit
Admission: RE | Admit: 2023-08-16 | Discharge: 2023-08-16 | Disposition: A | Discharge status: Home / Self Care | Attending: Gastroenterology | Admitting: Gastroenterology

## 2023-08-16 DIAGNOSIS — J45909 Unspecified asthma, uncomplicated: Secondary | ICD-10-CM | POA: Insufficient documentation

## 2023-08-16 DIAGNOSIS — Z9884 Bariatric surgery status: Secondary | ICD-10-CM | POA: Insufficient documentation

## 2023-08-16 DIAGNOSIS — D509 Iron deficiency anemia, unspecified: Secondary | ICD-10-CM | POA: Insufficient documentation

## 2023-08-16 DIAGNOSIS — K449 Diaphragmatic hernia without obstruction or gangrene: Secondary | ICD-10-CM | POA: Diagnosis not present

## 2023-08-16 DIAGNOSIS — Z9049 Acquired absence of other specified parts of digestive tract: Secondary | ICD-10-CM | POA: Insufficient documentation

## 2023-08-16 DIAGNOSIS — Z7951 Long term (current) use of inhaled steroids: Secondary | ICD-10-CM | POA: Insufficient documentation

## 2023-08-16 DIAGNOSIS — G4730 Sleep apnea, unspecified: Secondary | ICD-10-CM | POA: Insufficient documentation

## 2023-08-16 DIAGNOSIS — K641 Second degree hemorrhoids: Secondary | ICD-10-CM | POA: Insufficient documentation

## 2023-08-16 HISTORY — PX: ESOPHAGOGASTRODUODENOSCOPY: SHX5428

## 2023-08-16 HISTORY — PX: COLONOSCOPY: SHX5424

## 2023-08-16 SURGERY — COLONOSCOPY
Anesthesia: General

## 2023-08-16 MED ORDER — LIDOCAINE HCL (CARDIAC) PF 100 MG/5ML IV SOSY
PREFILLED_SYRINGE | INTRAVENOUS | Status: DC | PRN
  Administered 2023-08-16: 80 mg via INTRAVENOUS

## 2023-08-16 MED ORDER — SODIUM CHLORIDE 0.9 % IV SOLN: INTRAVENOUS | Status: DC

## 2023-08-16 MED ORDER — EPHEDRINE SULFATE-NACL 50-0.9 MG/10ML-% IV SOSY
PREFILLED_SYRINGE | INTRAVENOUS | Status: DC | PRN
  Administered 2023-08-16: 5 mg via INTRAVENOUS

## 2023-08-16 MED ORDER — PROPOFOL 10 MG/ML IV BOLUS
INTRAVENOUS | Status: DC | PRN
  Administered 2023-08-16: 120 mg via INTRAVENOUS

## 2023-08-16 MED ORDER — PROPOFOL 500 MG/50ML IV EMUL
INTRAVENOUS | Status: DC | PRN
  Administered 2023-08-16: 140 ug/kg/min via INTRAVENOUS

## 2023-08-16 NOTE — H&P (Signed)
 Outpatient short stay form Pre-procedure 08/16/2023  Shane Darling, MD  Primary Physician: Rex Castor, MD  Reason for visit:  IDA  History of present illness:    73 y/o lady with history of roux-en-y, cholecystectomy, and umbilical hernia repair here for EGD/Colonoscopy for IDA. No blood thinners. No family history of GI malignancies.    Current Facility-Administered Medications:    0.9 %  sodium chloride  infusion, , Intravenous, Continuous, Skylene Deremer, Leanora Prophet, MD, Last Rate: 20 mL/hr at 08/16/23 0828, New Bag at 08/16/23 0828  Medications Prior to Admission  Medication Sig Dispense Refill Last Dose/Taking   acyclovir (ZOVIRAX) 400 MG tablet Take 400 mg by mouth 5 (five) times daily.   08/15/2023   albuterol (PROVENTIL HFA;VENTOLIN HFA) 108 (90 Base) MCG/ACT inhaler Inhale into the lungs every 6 (six) hours as needed for wheezing or shortness of breath.   08/15/2023   budesonide (PULMICORT) 0.5 MG/2ML nebulizer solution Take 0.5 mg by nebulization 2 (two) times daily.   08/15/2023   calcium carbonate (OSCAL) 1500 (600 Ca) MG TABS tablet Take 600 mg of elemental calcium by mouth 2 (two) times daily with a meal.   08/15/2023   Celecoxib (CELEBREX PO) Take by mouth 2 (two) times daily.   08/15/2023   Cyanocobalamin (VITAMIN B 12 PO) Take 1,000 mcg by mouth daily.   08/15/2023   gabapentin (NEURONTIN) 100 MG capsule Take 100 mg by mouth 3 (three) times daily.   08/15/2023   ibuprofen (ADVIL,MOTRIN) 100 MG/5ML suspension Take 200 mg by mouth every 4 (four) hours as needed.   08/15/2023   ipratropium-albuterol (DUONEB) 0.5-2.5 (3) MG/3ML SOLN Take 3 mLs by nebulization.   08/15/2023   Lactase-Lactobacillus 15-190 MG CPDR Take by mouth.   08/15/2023   Multiple Vitamins-Minerals (MULTIVITAMIN WITH MINERALS) tablet Take 1 tablet by mouth daily.   08/15/2023   omeprazole (PRILOSEC) 20 MG capsule Take 20 mg by mouth daily.   08/15/2023   traZODone (DESYREL) 100 MG tablet Take 100 mg by mouth at bedtime.    08/15/2023   VITAMIN D, CHOLECALCIFEROL, PO Take by mouth daily.   08/15/2023     Allergies  Allergen Reactions   Latex Hives   Terramycin [Oxytetracycline] Hives   Vicodin [Hydrocodone-Acetaminophen]      Past Medical History:  Diagnosis Date   Asthma    Barrett's esophagus    Heart murmur    Osteopenia    Sleep apnea    WPW (Wolff-Parkinson-White syndrome)     Review of systems:  Otherwise negative.    Physical Exam  Gen: Alert, oriented. Appears stated age.  HEENT: PERRLA. Lungs: No respiratory distress CV: RRR Abd: soft, benign, no masses Ext: No edema    Planned procedures: Proceed with EGD/colonoscopy. The patient understands the nature of the planned procedure, indications, risks, alternatives and potential complications including but not limited to bleeding, infection, perforation, damage to internal organs and possible oversedation/side effects from anesthesia. The patient agrees and gives consent to proceed.  Please refer to procedure notes for findings, recommendations and patient disposition/instructions.     Shane Darling, MD [facility] Gastroenterology

## 2023-08-16 NOTE — Transfer of Care (Addendum)
 Immediate Anesthesia Transfer of Care Note  Patient: Kirsten Scott  Procedure(s) Performed: COLONOSCOPY EGD (ESOPHAGOGASTRODUODENOSCOPY)  Patient Location: Endoscopy Unit  Anesthesia Type:General  Level of Consciousness: awake and alert   Airway & Oxygen Therapy: Patient Spontanous Breathing  Post-op Assessment: Report given to RN and Post -op Vital signs reviewed and stable  Post vital signs: Reviewed and stable  Last Vitals:  Vitals Value Taken Time  BP 95/48 08/16/23 1003  Temp    Pulse 65 08/16/23 1004  Resp 20 08/16/23 1004  SpO2 97 % 08/16/23 1004  Vitals shown include unfiled device data.  Last Pain:  Vitals:   08/16/23 0823  TempSrc: Oral  PainSc: 0-No pain         Complications: No notable events documented.

## 2023-08-16 NOTE — Anesthesia Preprocedure Evaluation (Signed)
 Anesthesia Evaluation  Patient identified by MRN, date of birth, ID band Patient awake    Reviewed: Allergy & Precautions, H&P , NPO status , Patient's Chart, lab work & pertinent test results, reviewed documented beta blocker date and time   Airway Mallampati: II   Neck ROM: full    Dental  (+) Poor Dentition, Dental Advidsory Given   Pulmonary asthma , sleep apnea    Pulmonary exam normal        Cardiovascular Exercise Tolerance: Good Normal cardiovascular exam+ Valvular Problems/Murmurs  Rhythm:regular Rate:Normal     Neuro/Psych negative neurological ROS  negative psych ROS   GI/Hepatic negative GI ROS, Neg liver ROS,,,  Endo/Other  negative endocrine ROS    Renal/GU      Musculoskeletal   Abdominal   Peds  Hematology negative hematology ROS (+)   Anesthesia Other Findings Past Medical History: No date: Asthma No date: Barrett's esophagus No date: Heart murmur No date: Osteopenia No date: Sleep apnea No date: WPW (Wolff-Parkinson-White syndrome) Past Surgical History: No date: adenectomy No date: APPENDECTOMY No date: avulsion fx of distal fibula; Right No date: CHOLECYSTECTOMY 02/10/2018: COLONOSCOPY WITH PROPOFOL ; N/A     Comment:  Procedure: COLONOSCOPY WITH PROPOFOL ;  Surgeon:               Deveron Fly, MD;  Location: [facility] ENDOSCOPY;                Service: Endoscopy;  Laterality: N/A; 02/10/2018: ESOPHAGOGASTRODUODENOSCOPY (EGD) WITH PROPOFOL ; N/A     Comment:  Procedure: ESOPHAGOGASTRODUODENOSCOPY (EGD) WITH               PROPOFOL ;  Surgeon: Deveron Fly, MD;  Location:               [facility] ENDOSCOPY;  Service: Endoscopy;  Laterality: N/A; No date: FRACTURE SURGERY No date: HERNIA REPAIR No date: hip muscle repair No date: OOPHORECTOMY; Bilateral No date: ROUX-EN-Y PROCEDURE No date: sal-pin oopherectomy No date: sinus surgery fungal mass No date: TONSILLECTOMY No date: TUBAL  LIGATION   Reproductive/Obstetrics negative OB ROS                             Anesthesia Physical Anesthesia Plan  ASA: 3  Anesthesia Plan: General   Post-op Pain Management: Minimal or no pain anticipated   Induction: Intravenous  PONV Risk Score and Plan: 3 and Propofol  infusion, TIVA and Ondansetron  Airway Management Planned: Nasal Cannula  Additional Equipment: None  Intra-op Plan:   Post-operative Plan:   Informed Consent: I have reviewed the patients History and Physical, chart, labs and discussed the procedure including the risks, benefits and alternatives for the proposed anesthesia with the patient or authorized representative who has indicated his/her understanding and acceptance.     Dental advisory given  Plan Discussed with: CRNA and Surgeon  Anesthesia Plan Comments: (Discussed risks of anesthesia with patient, including possibility of difficulty with spontaneous ventilation under anesthesia necessitating airway intervention, PONV, and rare risks such as cardiac or respiratory or neurological events, and allergic reactions. Discussed the role of CRNA in patient's perioperative care. Patient understands.)        Anesthesia Quick Evaluation

## 2023-08-16 NOTE — Op Note (Signed)
 [facility] Gastroenterology Patient Name: Kirsten Scott [facility] Procedure Date: 08/16/2023 9:30 AM MRN: [id number] Account #: [id number] Date of Birth: [birth year] Admit Type: Outpatient Age: 73 Room: [facility] ENDO ROOM 3 Gender: Female Note Status: Supervisor Override Instrument Name: Charlyn Cooley [id number] Procedure:             Colonoscopy Indications:           Iron deficiency anemia, Follow-up for history of                         adenomatous polyps in the colon Providers:             Leida Puna MD, MD Referring MD:          No Local Md, MD (Referring MD) Medicines:             Monitored Anesthesia Care Complications:         No immediate complications. Procedure:             Pre-Anesthesia Assessment:                        - Prior to the procedure, a History and Physical was                         performed, and patient medications and allergies were                         reviewed. The patient is competent. The risks and                         benefits of the procedure and the sedation options and                         risks were discussed with the patient. All questions                         were answered and informed consent was obtained.                         Patient identification and proposed procedure were                         verified by the physician, the nurse, the                         anesthesiologist, the anesthetist and the technician                         in the endoscopy suite. Mental Status Examination:                         alert and oriented. Airway Examination: normal                         oropharyngeal airway and neck mobility. Respiratory                         Examination: clear to auscultation. CV Examination:  normal. Prophylactic Antibiotics: The patient does not                         require prophylactic antibiotics. Prior                         Anticoagulants: The patient has taken no  anticoagulant                         or antiplatelet agents. ASA Grade Assessment: III - A                         patient with severe systemic disease. After reviewing                         the risks and benefits, the patient was deemed in                         satisfactory condition to undergo the procedure. The                         anesthesia plan was to use monitored anesthesia care                         (MAC). Immediately prior to administration of                         medications, the patient was re-assessed for adequacy                         to receive sedatives. The heart rate, respiratory                         rate, oxygen saturations, blood pressure, adequacy of                         pulmonary ventilation, and response to care were                         monitored throughout the procedure. The physical                         status of the patient was re-assessed after the                         procedure.                        After obtaining informed consent, the colonoscope was                         passed under direct vision. Throughout the procedure,                         the patient's blood pressure, pulse, and oxygen                         saturations were monitored continuously. The  Colonoscope was introduced through the anus and                         advanced to the the terminal ileum, with                         identification of the appendiceal orifice and IC                         valve. The colonoscopy was performed without                         difficulty. The patient tolerated the procedure well.                         The quality of the bowel preparation was adequate to                         identify polyps. The terminal ileum, ileocecal valve,                         appendiceal orifice, and rectum were photographed. Findings:      The perianal and digital rectal examinations were normal.      The  terminal ileum appeared normal.      Internal hemorrhoids were found during retroflexion. The hemorrhoids       were Grade II (internal hemorrhoids that prolapse but reduce       spontaneously).      The exam was otherwise without abnormality on direct and retroflexion       views. Impression:            - The examined portion of the ileum was normal.                        - Internal hemorrhoids.                        - The examination was otherwise normal on direct and                         retroflexion views.                        - No specimens collected. Recommendation:        - Discharge patient to home.                        - Resume previous diet.                        - Continue present medications.                        - Repeat colonoscopy is not recommended due to current                         age (57 years or older) for surveillance.                        - Return to referring physician as previously  scheduled. Procedure Code(s):     --- Professional ---                        [phone], Colonoscopy, flexible; diagnostic, including                         collection of specimen(s) by brushing or washing, when                         performed (separate procedure) Diagnosis Code(s):     --- Professional ---                        K64.1, Second degree hemorrhoids                        D50.9, Iron deficiency anemia, unspecified CPT copyright 2022 American [employer]. All rights reserved. The codes documented in this report are preliminary and upon coder review may  be revised to meet current compliance requirements. Leida Puna MD, MD 08/16/2023 10:07:57 AM Number of Addenda: 0 Note Initiated On: 08/16/2023 9:30 AM Scope Withdrawal Time: 0 hours 10 minutes 13 seconds  Total Procedure Duration: 0 hours 15 minutes 38 seconds  Estimated Blood Loss:  Estimated blood loss: none.      [facility]

## 2023-08-16 NOTE — Anesthesia Postprocedure Evaluation (Signed)
 Anesthesia Post Note  Patient: Kirsten Scott  Procedure(s) Performed: COLONOSCOPY EGD (ESOPHAGOGASTRODUODENOSCOPY)  Patient location during evaluation: PACU Anesthesia Type: General Level of consciousness: awake and alert Pain management: pain level controlled Vital Signs Assessment: post-procedure vital signs reviewed and stable Respiratory status: spontaneous breathing, nonlabored ventilation, respiratory function stable and patient connected to nasal cannula oxygen Cardiovascular status: blood pressure returned to baseline and stable Postop Assessment: no apparent nausea or vomiting Anesthetic complications: no  No notable events documented.   Last Vitals:  Vitals:   08/16/23 1004 08/16/23 1014  BP: (!) 95/48 90/75  Pulse: 65 62  Resp: 20 15  Temp:    SpO2: 97% 99%    Last Pain:  Vitals:   08/16/23 1014  TempSrc: Temporal  PainSc: 0-No pain                 Enrique Harvest

## 2023-08-16 NOTE — Op Note (Signed)
 [facility] Gastroenterology Patient Name: Kirsten Scott [facility] Procedure Date: 08/16/2023 9:31 AM MRN: [id number] Account #: [id number] Date of Birth: 1950-04-10 Admit Type: Outpatient Age: 73 Room: [facility] ENDO ROOM 3 Gender: Female Note Status: Finalized Instrument Name: Upper Endoscope [id number] Procedure:             Upper GI endoscopy Indications:           Iron deficiency anemia Providers:             Leida Puna MD, MD Referring MD:          No Local Md, MD (Referring MD) Medicines:             Monitored Anesthesia Care Complications:         No immediate complications. Procedure:             Pre-Anesthesia Assessment:                        - Prior to the procedure, a History and Physical was                         performed, and patient medications and allergies were                         reviewed. The patient is competent. The risks and                         benefits of the procedure and the sedation options and                         risks were discussed with the patient. All questions                         were answered and informed consent was obtained.                         Patient identification and proposed procedure were                         verified by the physician, the nurse, the                         anesthesiologist, the anesthetist and the technician                         in the endoscopy suite. Mental Status Examination:                         alert and oriented. Airway Examination: normal                         oropharyngeal airway and neck mobility. Respiratory                         Examination: clear to auscultation. CV Examination:                         normal. Prophylactic Antibiotics: The patient does not  require prophylactic antibiotics. Prior                         Anticoagulants: The patient has taken no anticoagulant                         or antiplatelet agents. ASA Grade Assessment: III  - A                         patient with severe systemic disease. After reviewing                         the risks and benefits, the patient was deemed in                         satisfactory condition to undergo the procedure. The                         anesthesia plan was to use monitored anesthesia care                         (MAC). Immediately prior to administration of                         medications, the patient was re-assessed for adequacy                         to receive sedatives. The heart rate, respiratory                         rate, oxygen saturations, blood pressure, adequacy of                         pulmonary ventilation, and response to care were                         monitored throughout the procedure. The physical                         status of the patient was re-assessed after the                         procedure.                        After obtaining informed consent, the endoscope was                         passed under direct vision. Throughout the procedure,                         the patient's blood pressure, pulse, and oxygen                         saturations were monitored continuously. The Endoscope                         was introduced through the mouth, and advanced to the  second part of duodenum. The upper GI endoscopy was                         accomplished without difficulty. The patient tolerated                         the procedure well. Findings:      A small hiatal hernia was present.      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 This was       characterized by healthy appearing mucosa.      The examined jejunum was normal. Impression:            - Small hiatal hernia.                        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                        - Normal examined jejunum.                        - No specimens  collected. Recommendation:        - Discharge patient to home.                        - Resume previous diet.                        - Continue present medications.                        - Return to referring physician as previously                         scheduled. Procedure Code(s):     --- Professional ---                        [phone], Esophagogastroduodenoscopy, flexible,                         transoral; diagnostic, including collection of                         specimen(s) by brushing or washing, when performed                         (separate procedure) Diagnosis Code(s):     --- Professional ---                        K44.9, Diaphragmatic hernia without obstruction or                         gangrene                        Z98.84, Bariatric surgery status  D50.9, Iron deficiency anemia, unspecified CPT copyright 2022 American Medical Association. All rights reserved. The codes documented in this report are preliminary and upon coder review may  be revised to meet current compliance requirements. Leida Puna MD, MD 08/16/2023 10:04:47 AM Number of Addenda: 0 Note Initiated On: 08/16/2023 9:31 AM Estimated Blood Loss:  Estimated blood loss: none.      [facility]

## 2023-08-16 NOTE — Interval H&P Note (Signed)
 History and Physical Interval Note:  08/16/2023 9:22 AM  Kirsten Scott  has presented today for surgery, with the diagnosis of IDA h/o TA Polyps.  The various methods of treatment have been discussed with the patient and family. After consideration of risks, benefits and other options for treatment, the patient has consented to  Procedure(s): COLONOSCOPY (N/A) EGD (ESOPHAGOGASTRODUODENOSCOPY) (N/A) as a surgical intervention.  The patient's history has been reviewed, patient examined, no change in status, stable for surgery.  I have reviewed the patient's chart and labs.  Questions were answered to the patient's satisfaction.     Shane Darling  Ok to proceed with EGD/Colonoscopy

## 2023-08-17 ENCOUNTER — Encounter: Payer: Self-pay | Admitting: Gastroenterology

## 2023-08-17 ENCOUNTER — Other Ambulatory Visit
Admission: RE | Admit: 2023-08-17 | Discharge: 2023-08-17 | Disposition: A | Discharge status: Home / Self Care | Payer: Self-pay | Source: Ambulatory Visit | Attending: Medical Genetics | Admitting: Medical Genetics

## 2023-08-28 LAB — GENECONNECT MOLECULAR SCREEN: Genetic Analysis Overall Interpretation: NEGATIVE

## 2024-03-22 ENCOUNTER — Other Ambulatory Visit: Payer: Self-pay | Admitting: Internal Medicine

## 2024-03-22 DIAGNOSIS — Z1231 Encounter for screening mammogram for malignant neoplasm of breast: Secondary | ICD-10-CM

## 2024-04-19 ENCOUNTER — Ambulatory Visit
